# Patient Record
Sex: Female | Born: 2000 | Hispanic: Yes | Marital: Single | Smoking: Never smoker
Health system: Southern US, Community
[De-identification: ages and names within clinical notes are randomized; demographics above are authoritative.]

## PROBLEM LIST (undated history)

## (undated) HISTORY — PX: OTHER SURGICAL HISTORY: SHX169

---
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
## 2019-05-06 ENCOUNTER — Other Ambulatory Visit: Payer: Self-pay

## 2019-05-06 DIAGNOSIS — Z20822 Contact with and (suspected) exposure to covid-19: Secondary | ICD-10-CM

## 2019-05-08 LAB — NOVEL CORONAVIRUS, NAA: SARS-CoV-2, NAA: DETECTED — AB

## 2019-05-09 ENCOUNTER — Telehealth: Payer: Self-pay | Admitting: General Practice

## 2019-05-09 NOTE — Telephone Encounter (Signed)
Per pt. request, faxed COVID results to employer @ [phone], attention Jearld Lesch.

## 2019-05-09 NOTE — Telephone Encounter (Signed)
Copied from [place] [id number]. Topic: General - Other >> May 09, 2019 11:39 AM Keene Breath wrote: Reason for CRM: Patient called to request that her COVID positive test result be faxed to a secured #, [phone], Attention:  Jearld Lesch.  Patient indicated that it needs to be done as soon as possible.

## 2019-08-27 ENCOUNTER — Other Ambulatory Visit: Payer: Self-pay

## 2019-08-27 ENCOUNTER — Ambulatory Visit (LOCAL_COMMUNITY_HEALTH_CENTER): Payer: Medicaid Other

## 2019-08-27 VITALS — BP 113/71 | Ht 63.5 in | Wt 165.5 lb

## 2019-08-27 DIAGNOSIS — Z3201 Encounter for pregnancy test, result positive: Secondary | ICD-10-CM | POA: Diagnosis not present

## 2019-08-27 DIAGNOSIS — K8050 Calculus of bile duct without cholangitis or cholecystitis without obstruction: Secondary | ICD-10-CM

## 2019-08-27 HISTORY — DX: Calculus of bile duct without cholangitis or cholecystitis without obstruction: K80.50

## 2019-08-27 LAB — PREGNANCY, URINE: Preg Test, Ur: POSITIVE — AB

## 2019-08-27 MED ORDER — PRENATAL VITAMIN 27-0.8 MG PO TABS: 1.0000 | ORAL_TABLET | Freq: Every day | ORAL | 0 refills | Status: AC

## 2019-08-27 NOTE — Progress Notes (Signed)
Pt plans prenatal care at [facility]; sent to preadmit. 

## 2019-08-28 ENCOUNTER — Emergency Department
Admission: EM | Admit: 2019-08-28 | Discharge: 2019-08-28 | Disposition: A | Discharge status: Home / Self Care | Payer: Medicaid Other | Attending: Emergency Medicine | Admitting: Emergency Medicine

## 2019-08-28 ENCOUNTER — Other Ambulatory Visit: Payer: Self-pay

## 2019-08-28 ENCOUNTER — Encounter: Payer: Self-pay | Admitting: *Deleted

## 2019-08-28 ENCOUNTER — Emergency Department: Payer: Medicaid Other

## 2019-08-28 DIAGNOSIS — K8050 Calculus of bile duct without cholangitis or cholecystitis without obstruction: Secondary | ICD-10-CM

## 2019-08-28 DIAGNOSIS — Z79899 Other long term (current) drug therapy: Secondary | ICD-10-CM | POA: Insufficient documentation

## 2019-08-28 DIAGNOSIS — Z3A10 10 weeks gestation of pregnancy: Secondary | ICD-10-CM | POA: Diagnosis not present

## 2019-08-28 DIAGNOSIS — O26611 Liver and biliary tract disorders in pregnancy, first trimester: Secondary | ICD-10-CM | POA: Diagnosis present

## 2019-08-28 DIAGNOSIS — R52 Pain, unspecified: Secondary | ICD-10-CM

## 2019-08-28 LAB — URINALYSIS, COMPLETE (UACMP) WITH MICROSCOPIC
Bacteria, UA: NONE SEEN
Bilirubin Urine: NEGATIVE
Glucose, UA: NEGATIVE mg/dL
Hgb urine dipstick: NEGATIVE
Ketones, ur: 5 mg/dL — AB
Leukocytes,Ua: NEGATIVE
Nitrite: NEGATIVE
Protein, ur: 30 mg/dL — AB
Specific Gravity, Urine: 1.029 (ref 1.005–1.030)
pH: 5 (ref 5.0–8.0)

## 2019-08-28 LAB — COMPREHENSIVE METABOLIC PANEL
ALT: 24 U/L (ref 0–44)
AST: 23 U/L (ref 15–41)
Albumin: 3.7 g/dL (ref 3.5–5.0)
Alkaline Phosphatase: 56 U/L (ref 38–126)
Anion gap: 11 (ref 5–15)
BUN: 8 mg/dL (ref 6–20)
CO2: 21 mmol/L — ABNORMAL LOW (ref 22–32)
Calcium: 9.4 mg/dL (ref 8.9–10.3)
Chloride: 106 mmol/L (ref 98–111)
Creatinine, Ser: 0.51 mg/dL (ref 0.44–1.00)
GFR calc Af Amer: >60 mL/min (ref >60)
GFR calc non Af Amer: >60 mL/min (ref >60)
Glucose, Bld: 101 mg/dL — ABNORMAL HIGH (ref 70–99)
Potassium: 3.1 mmol/L — ABNORMAL LOW (ref 3.5–5.1)
Sodium: 138 mmol/L (ref 135–145)
Total Bilirubin: 0.4 mg/dL (ref 0.3–1.2)
Total Protein: 7.3 g/dL (ref 6.5–8.1)

## 2019-08-28 LAB — LIPASE, BLOOD: Lipase: 25 U/L (ref 11–51)

## 2019-08-28 LAB — CBC
HCT: 33.1 % — ABNORMAL LOW (ref 36.0–46.0)
Hemoglobin: 11.2 g/dL — ABNORMAL LOW (ref 12.0–15.0)
MCH: 24 pg — ABNORMAL LOW (ref 26.0–34.0)
MCHC: 33.8 g/dL (ref 30.0–36.0)
MCV: 71 fL — ABNORMAL LOW (ref 80.0–100.0)
Platelets: 337 10*3/uL (ref 150–400)
RBC: 4.66 MIL/uL (ref 3.87–5.11)
RDW: 15 % (ref 11.5–15.5)
WBC: 8.8 10*3/uL (ref 4.0–10.5)
nRBC: 0 % (ref 0.0–0.2)

## 2019-08-28 LAB — HCG, QUANTITATIVE, PREGNANCY: hCG, Beta Chain, Quant, S: 113463 m[IU]/mL — ABNORMAL HIGH (ref <5)

## 2019-08-28 MED ORDER — ALUM & MAG HYDROXIDE-SIMETH 200-200-20 MG/5ML PO SUSP: 30.0000 mL | Freq: Once | ORAL | Status: DC

## 2019-08-28 MED ORDER — LIDOCAINE VISCOUS HCL 2 % MT SOLN: 15.0000 mL | Freq: Once | OROMUCOSAL | Status: DC

## 2019-08-28 MED ORDER — SODIUM CHLORIDE 0.9 % IV BOLUS: 1000.0000 mL | Freq: Once | INTRAVENOUS | Status: DC

## 2019-08-28 MED ORDER — SODIUM CHLORIDE 0.9% FLUSH: 3.0000 mL | Freq: Once | INTRAVENOUS | Status: DC

## 2019-08-28 MED ORDER — ONDANSETRON HCL 4 MG/2ML IJ SOLN: 4.0000 mg | Freq: Once | INTRAMUSCULAR | Status: DC

## 2019-08-28 NOTE — ED Triage Notes (Signed)
Upper abdominal pain that started after eating cereal, had 1 episode of vomiting. Also pain radiates to the back area. Pt reports she is pregnant, LMP 06/16/19. Denies any vaginal bleeding or discharge. No fevers.

## 2019-08-28 NOTE — ED Provider Notes (Signed)
[facility] Emergency Department Provider Note  Time seen: 7:17 AM  I have reviewed the triage vital signs and the nursing notes.   HISTORY  Chief Complaint Abdominal Pain   HPI Jasmin Zimmerman is a 18 y.o. female approximately [redacted] weeks pregnant with no other past medical issues presents to the emergency department for upper abdominal pain.  Patient states she ate an approximate hour later developed upper abdominal pain.  Patient states the pain was moderate in intensity aching type pain.  She states during her stay in the emergency department the pain has completely resolved and the patient is currently pain-free.  Denies any fever cough or shortness of breath nausea vomiting or diarrhea.  No vaginal bleeding or discharge.  History reviewed. No pertinent past medical history.  There are no active problems to display for this patient.   History reviewed. No pertinent surgical history.  Prior to Admission medications   Medication Sig Start Date End Date Taking? Authorizing Provider  azithromycin (ZITHROMAX Z-PAK) 250 MG tablet Take 2 tablets (500 mg) on  Day 1,  followed by 1 tablet (250 mg) once daily on Days 2 through 5. Patient not taking: Reported on 08/27/2019 12/24/15   Arlyss Repress, PA-C  guaiFENesin-codeine 100-10 MG/5ML syrup Take 5 mLs by mouth every 4 (four) hours as needed for cough. Patient not taking: Reported on 08/27/2019 12/24/15   Beers, Pierce Crane, PA-C  ibuprofen (ADVIL,MOTRIN) 600 MG tablet Take 1 tablet (600 mg total) by mouth every 6 (six) hours as needed. Patient not taking: Reported on 08/27/2019 12/24/15   Arlyss Repress, PA-C  Prenatal Vit-Fe Fumarate-FA (PRENATAL VITAMIN) 27-0.8 MG TABS Take 1 tablet by mouth daily. 08/27/19 12/05/19  Caren Macadam, MD    No Known Allergies  No family history on file.  Social History Social History   Tobacco Use  . Smoking status: Never Smoker  . Smokeless tobacco: Never Used   Substance Use Topics  . Alcohol use: Never    Frequency: Never  . Drug use: Never    Review of Systems Constitutional: Negative for fever. Cardiovascular: Negative for chest pain. Respiratory: Negative for shortness of breath. Gastrointestinal: Upper abdominal pain, now resolved.  Negative for vomiting or diarrhea. Genitourinary: Negative for urinary compaints Musculoskeletal: Negative for musculoskeletal complaints Neurological: Negative for headache All other ROS negative  ____________________________________________   PHYSICAL EXAM:  VITAL SIGNS: ED Triage Vitals [08/28/19 0055]  Enc Vitals Group     BP 107/60     Pulse Rate 86     Resp 20     Temp 99.4 F (37.4 C)     Temp Source Oral     SpO2 100 %     Weight      Height      Head Circumference      Peak Flow      Pain Score 10     Pain Loc      Pain Edu?      Excl. in [place]?     Constitutional: Alert and oriented. Well appearing and in no distress. Eyes: Normal exam ENT      Head: Normocephalic and atraumatic.      Mouth/Throat: Mucous membranes are moist. Cardiovascular: Normal rate, regular rhythm.  Respiratory: Normal respiratory effort without tachypnea nor retractions. Breath sounds are clear Gastrointestinal: Soft and nontender. No distention.   Musculoskeletal: Nontender with normal range of motion in all extremities.  Neurologic:  Normal speech and language. No  gross focal neurologic deficits  Skin:  Skin is warm, dry and intact.  Psychiatric: Mood and affect are normal.   ____________________________________________   RADIOLOGY  Ultrasound shows gallbladder sludge with small stones but no complications seen.  ____________________________________________   INITIAL IMPRESSION / ASSESSMENT AND PLAN / ED COURSE  Pertinent labs & imaging results that were available during my care of the patient were reviewed by me and considered in my medical decision making (see chart for details).    Patient presents to the emergency department for upper abdominal pain after eating.  Differential would include gallbladder pathology or cholecystitis, gastritis or pancreatitis.  Patient's labs are largely reassuring including normal LFTs and lipase.  Right upper quadrant ultrasound shows sludge and small stones but no signs of cholecystitis.  Patient is completely pain-free at this time with a benign abdominal exam during my evaluation.  I discussed with the patient low-fat diet and continued close monitoring.  If the pain returns or she develops a fever she is to return to the emergency department for further evaluation.  Patient agreeable to plan of care.  Jasmin Zimmerman was evaluated in Emergency Department on 08/28/2019 for the symptoms described in the history of present illness. She was evaluated in the context of the global COVID-19 pandemic, which necessitated consideration that the patient might be at risk for infection with the SARS-CoV-2 virus that causes COVID-19. Institutional protocols and algorithms that pertain to the evaluation of patients at risk for COVID-19 are in a state of rapid change based on information released by regulatory bodies including the CDC and federal and state organizations. These policies and algorithms were followed during the patient's care in the ED.  ____________________________________________   FINAL CLINICAL IMPRESSION(S) / ED DIAGNOSES  Biliary colic   Minna Antis, MD 08/28/19 [phone]

## 2019-09-03 ENCOUNTER — Encounter: Payer: Self-pay | Admitting: Advanced Practice Midwife

## 2019-09-03 ENCOUNTER — Emergency Department
Admission: EM | Admit: 2019-09-03 | Discharge: 2019-09-03 | Disposition: A | Discharge status: Home / Self Care | Payer: Medicaid Other | Attending: Emergency Medicine | Admitting: Emergency Medicine

## 2019-09-03 ENCOUNTER — Other Ambulatory Visit: Payer: Self-pay

## 2019-09-03 ENCOUNTER — Ambulatory Visit: Payer: Medicaid Other | Admitting: Family Medicine

## 2019-09-03 ENCOUNTER — Encounter: Payer: Self-pay | Admitting: Family Medicine

## 2019-09-03 VITALS — BP 114/64 | Temp 98.4°F | Ht 61.5 in | Wt 160.6 lb

## 2019-09-03 DIAGNOSIS — Z3400 Encounter for supervision of normal first pregnancy, unspecified trimester: Secondary | ICD-10-CM

## 2019-09-03 DIAGNOSIS — Z8719 Personal history of other diseases of the digestive system: Secondary | ICD-10-CM | POA: Insufficient documentation

## 2019-09-03 DIAGNOSIS — E860 Dehydration: Secondary | ICD-10-CM | POA: Diagnosis not present

## 2019-09-03 DIAGNOSIS — O219 Vomiting of pregnancy, unspecified: Secondary | ICD-10-CM | POA: Insufficient documentation

## 2019-09-03 DIAGNOSIS — Z3A11 11 weeks gestation of pregnancy: Secondary | ICD-10-CM | POA: Insufficient documentation

## 2019-09-03 DIAGNOSIS — Z79899 Other long term (current) drug therapy: Secondary | ICD-10-CM | POA: Diagnosis not present

## 2019-09-03 HISTORY — DX: Personal history of other diseases of the digestive system: Z87.19

## 2019-09-03 LAB — CBC
HCT: 36.1 % (ref 36.0–46.0)
Hemoglobin: 12.2 g/dL (ref 12.0–15.0)
MCH: 24.2 pg — ABNORMAL LOW (ref 26.0–34.0)
MCHC: 33.8 g/dL (ref 30.0–36.0)
MCV: 71.5 fL — ABNORMAL LOW (ref 80.0–100.0)
Platelets: 335 10*3/uL (ref 150–400)
RBC: 5.05 MIL/uL (ref 3.87–5.11)
RDW: 14.9 % (ref 11.5–15.5)
WBC: 7.3 10*3/uL (ref 4.0–10.5)
nRBC: 0 % (ref 0.0–0.2)

## 2019-09-03 LAB — URINALYSIS
Bilirubin, UA: POSITIVE — AB
Glucose, UA: NEGATIVE
Leukocytes,UA: NEGATIVE
Nitrite, UA: NEGATIVE
Specific Gravity, UA: 1.03 (ref 1.005–1.030)
Urobilinogen, Ur: 0.2 mg/dL (ref 0.2–1.0)
pH, UA: 6 (ref 5.0–7.5)

## 2019-09-03 LAB — URINALYSIS, COMPLETE (UACMP) WITH MICROSCOPIC
Bacteria, UA: NONE SEEN
Bilirubin Urine: NEGATIVE
Glucose, UA: 50 mg/dL — AB
Hgb urine dipstick: NEGATIVE
Ketones, ur: 80 mg/dL — AB
Leukocytes,Ua: NEGATIVE
Nitrite: NEGATIVE
Protein, ur: 100 mg/dL — AB
Specific Gravity, Urine: 1.032 — ABNORMAL HIGH (ref 1.005–1.030)
pH: 6 (ref 5.0–8.0)

## 2019-09-03 LAB — COMPREHENSIVE METABOLIC PANEL
ALT: 30 U/L (ref 0–44)
AST: 30 U/L (ref 15–41)
Albumin: 4 g/dL (ref 3.5–5.0)
Alkaline Phosphatase: 66 U/L (ref 38–126)
Anion gap: 11 (ref 5–15)
BUN: 8 mg/dL (ref 6–20)
CO2: 21 mmol/L — ABNORMAL LOW (ref 22–32)
Calcium: 9.2 mg/dL (ref 8.9–10.3)
Chloride: 101 mmol/L (ref 98–111)
Creatinine, Ser: 0.49 mg/dL (ref 0.44–1.00)
GFR calc Af Amer: >60 mL/min (ref >60)
GFR calc non Af Amer: >60 mL/min (ref >60)
Glucose, Bld: 122 mg/dL — ABNORMAL HIGH (ref 70–99)
Potassium: 3.2 mmol/L — ABNORMAL LOW (ref 3.5–5.1)
Sodium: 133 mmol/L — ABNORMAL LOW (ref 135–145)
Total Bilirubin: 0.5 mg/dL (ref 0.3–1.2)
Total Protein: 7.8 g/dL (ref 6.5–8.1)

## 2019-09-03 LAB — POC URINE PREG, ED: Preg Test, Ur: POSITIVE — AB

## 2019-09-03 LAB — WET PREP FOR TRICH, YEAST, CLUE
Trichomonas Exam: NEGATIVE
Yeast Exam: NEGATIVE

## 2019-09-03 LAB — LIPASE, BLOOD: Lipase: 33 U/L (ref 11–51)

## 2019-09-03 LAB — HEMOGLOBIN, FINGERSTICK: Hemoglobin: 12.3 g/dL (ref 11.1–15.9)

## 2019-09-03 LAB — OB RESULTS CONSOLE HIV ANTIBODY (ROUTINE TESTING): HIV: NONREACTIVE

## 2019-09-03 MED ORDER — ONDANSETRON HCL 4 MG/2ML IJ SOLN
4.0000 mg | Freq: Once | INTRAMUSCULAR | Status: AC
  Administered 2019-09-03: 4 mg via INTRAVENOUS
  Filled 2019-09-03: qty 2

## 2019-09-03 MED ORDER — SODIUM CHLORIDE 0.9% FLUSH
3.0000 mL | Freq: Once | INTRAVENOUS | Status: AC
  Administered 2019-09-03: 3 mL via INTRAVENOUS

## 2019-09-03 MED ORDER — ONDANSETRON HCL 4 MG PO TABS: 4.0000 mg | ORAL_TABLET | Freq: Three times a day (TID) | ORAL | 0 refills | Status: DC | PRN

## 2019-09-03 MED ORDER — LACTATED RINGERS IV BOLUS
2000.0000 mL | Freq: Once | INTRAVENOUS | Status: AC
  Administered 2019-09-03: 2000 mL via INTRAVENOUS

## 2019-09-03 NOTE — Progress Notes (Signed)
Record abstracted per 09/01/19 phone interview with Tawanna Solo, RN; Debera Lat, RN

## 2019-09-03 NOTE — ED Notes (Signed)
Pt given crackers and gingerale.

## 2019-09-03 NOTE — ED Provider Notes (Signed)
[facility] Emergency Department Provider Note  ____________________________________________   First MD Initiated Contact with Patient 09/03/19 1954     (approximate)  I have reviewed the triage vital signs and the nursing notes.   HISTORY  Chief Complaint Emesis    HPI Jasmin Zimmerman Jasmin Zimmerman is a 18 y.o. female  Here with n/v. Pt is currently [redacted] weeks pregnant, was sent here from OB 2/2 dehydration. She has been having persistently worsening nausea, vomiting over the past several weeks. She states that over the past week, she's had persistent n/v with decreased food intake. She went to her OB today and was noted to have ketonuria, so was sent here for fluids. She also was seen recently for abd pain w/ GB sludge, but has had no ongoing abd pain or epigastric or RUQ pain. NO fevers. NO urinary sx. No vaginal bleeding or discharge.        Past Medical History:  Diagnosis Date  . Biliary colic 08/27/2019   @ ED-small gallstones seem    Patient Active Problem List   Diagnosis Date Noted  . Supervision of normal first pregnancy, antepartum 09/03/2019  . Nausea/vomiting in pregnancy 09/03/2019  . History of biliary colic 09/03/2019    Past Surgical History:  Procedure Laterality Date  . left shoulder surgery Left    Left shoulder surgery in middle school, unsure date and reason    Prior to Admission medications   Medication Sig Start Date End Date Taking? Authorizing Provider  ondansetron (ZOFRAN) 4 MG tablet Take 1 tablet (4 mg total) by mouth every 8 (eight) hours as needed for nausea or vomiting. 09/03/19   Staples, Hulda Humphrey, PA-C  Prenatal Vit-Fe Fumarate-FA (PRENATAL VITAMIN) 27-0.8 MG TABS Take 1 tablet by mouth daily. 08/27/19 12/05/19  Federico Flake, MD    Allergies Patient has no known allergies.  Family History  Problem Relation Age of Onset  . Diabetes Maternal Uncle   . Diabetes Maternal Grandmother   . Vision loss Maternal  Grandmother   . Diabetes Maternal Grandfather   . Kidney disease Maternal Grandfather     Social History Social History   Tobacco Use  . Smoking status: Never Smoker  . Smokeless tobacco: Never Used  Substance Use Topics  . Alcohol use: Never    Frequency: Never  . Drug use: Never    Review of Systems  Review of Systems  Constitutional: Positive for fatigue. Negative for fever.  HENT: Negative for congestion and sore throat.   Eyes: Negative for visual disturbance.  Respiratory: Negative for cough and shortness of breath.   Cardiovascular: Negative for chest pain.  Gastrointestinal: Positive for nausea and vomiting. Negative for abdominal pain and diarrhea.  Genitourinary: Negative for flank pain.  Musculoskeletal: Negative for back pain and neck pain.  Skin: Negative for rash and wound.  Neurological: Negative for weakness.  All other systems reviewed and are negative.    ____________________________________________  PHYSICAL EXAM:      VITAL SIGNS: ED Triage Vitals  Enc Vitals Group     BP 09/03/19 1825 131/81     Pulse Rate 09/03/19 1825 88     Resp 09/03/19 1825 20     Temp 09/03/19 1825 99.5 F (37.5 C)     Temp Source 09/03/19 1825 Oral     SpO2 09/03/19 1825 100 %     Weight 09/03/19 1825 160 lb (72.6 kg)     Height 09/03/19 1825 5' 1.5" (1.562 m)  Head Circumference --      Peak Flow --      Pain Score 09/03/19 1836 0     Pain Loc --      Pain Edu? --      Excl. in [place]? --      Physical Exam Vitals signs and nursing note reviewed.  Constitutional:      General: She is not in acute distress.    Appearance: She is well-developed.  HENT:     Head: Normocephalic and atraumatic.     Mouth/Throat:     Mouth: Mucous membranes are dry.  Eyes:     Conjunctiva/sclera: Conjunctivae normal.  Neck:     Musculoskeletal: Neck supple.  Cardiovascular:     Rate and Rhythm: Normal rate and regular rhythm.     Heart sounds: Normal heart sounds. No  murmur. No friction rub.  Pulmonary:     Effort: Pulmonary effort is normal. No respiratory distress.     Breath sounds: Normal breath sounds. No wheezing or rales.  Abdominal:     General: There is no distension.     Palpations: Abdomen is soft.     Tenderness: There is no abdominal tenderness.  Skin:    General: Skin is warm.     Capillary Refill: Capillary refill takes less than 2 seconds.  Neurological:     Mental Status: She is alert and oriented to person, place, and time.     Motor: No abnormal muscle tone.       ____________________________________________   LABS (all labs ordered are listed, but only abnormal results are displayed)  Labs Reviewed  COMPREHENSIVE METABOLIC PANEL - Abnormal; Notable for the following components:      Result Value   Sodium 133 (*)    Potassium 3.2 (*)    CO2 21 (*)    Glucose, Bld 122 (*)    All other components within normal limits  CBC - Abnormal; Notable for the following components:   MCV 71.5 (*)    MCH 24.2 (*)    All other components within normal limits  URINALYSIS, COMPLETE (UACMP) WITH MICROSCOPIC - Abnormal; Notable for the following components:   Color, Urine YELLOW (*)    APPearance HAZY (*)    Specific Gravity, Urine 1.032 (*)    Glucose, UA 50 (*)    Ketones, ur 80 (*)    Protein, ur 100 (*)    All other components within normal limits  POC URINE PREG, ED - Abnormal; Notable for the following components:   Preg Test, Ur Positive (*)    All other components within normal limits  LIPASE, BLOOD    ____________________________________________  EKG: None ________________________________________  RADIOLOGY All imaging, including plain films, CT scans, and ultrasounds, independently reviewed by me, and interpretations confirmed via formal radiology reads.  ED MD interpretation:   None  Official radiology report(s): No results found.  ____________________________________________  PROCEDURES    Procedure(s) performed (including Critical Care):  Procedures  ____________________________________________  INITIAL IMPRESSION / MDM / ASSESSMENT AND PLAN / ED COURSE  As part of my medical decision making, I reviewed the following data within the electronic MEDICAL RECORD NUMBER Nursing notes reviewed and incorporated, Old chart reviewed, Notes from prior ED visits, and [place] Controlled Substance Database       *Emberly M Jasmin PotterSosa Jimenez was evaluated in Emergency Department on 09/03/2019 for the symptoms described in the history of present illness. She was evaluated in the context of the global COVID-19 pandemic,  which necessitated consideration that the patient might be at risk for infection with the SARS-CoV-2 virus that causes COVID-19. Institutional protocols and algorithms that pertain to the evaluation of patients at risk for COVID-19 are in a state of rapid change based on information released by regulatory bodies including the CDC and federal and state organizations. These policies and algorithms were followed during the patient's care in the ED.  Some ED evaluations and interventions may be delayed as a result of limited staffing during the pandemic.*     Medical Decision Making:  18 yo G1P0 here with dehydration 2/2 hyperemesis gravidarum. She has a h/o GB sludge but has no abd pain, no RUQ TTP, normal bili and LFTs - doubt cholecystitis. Abd is otherwise benign. UA is without bacteriuria or pyuria. She has ketonuria but no significant hyperglycemia, normal AG - doubt DKA. Labs are c/w dehydration.  Pt given fluids, Zofran. Tolerated well and is now eating/drinking. Meds were called in her by her OB.  ____________________________________________  FINAL CLINICAL IMPRESSION(S) / ED DIAGNOSES  Final diagnoses:  Dehydration  Nausea/vomiting in pregnancy     MEDICATIONS GIVEN DURING THIS VISIT:  Medications  sodium chloride flush (NS) 0.9 % injection 3 mL (3 mLs Intravenous Given 09/03/19  2014)  lactated ringers bolus 2,000 mL (2,000 mLs Intravenous New Bag/Given 09/03/19 2013)  ondansetron (ZOFRAN) injection 4 mg (4 mg Intravenous Given 09/03/19 2014)     ED Discharge Orders    None       Note:  This document was prepared using Dragon voice recognition software and may include unintentional dictation errors.   Duffy Bruce, MD 09/03/19 2137

## 2019-09-03 NOTE — ED Triage Notes (Signed)
Pt comes via POV from home with c/o vomiting. Pt states she is about [redacted] weeks pregnant.  Pt states she has had this vomiting going on since November when she found out she was pregnant.  Pt states she went to [facility] appt today and had blood drawn. Pt states she was then informed that she was dehydrated and needed to come here.

## 2019-09-03 NOTE — Progress Notes (Addendum)
[place] DEPT [facility] [place] [id number] [phone]  INITIAL PRENATAL VISIT NOTE  Subjective:  Jasmin Zimmerman is a 18 y.o. G1P0000 at [redacted]w[redacted]d being seen today to start prenatal care at the [facility].  She is currently monitored for the following issues for this low-risk pregnancy and has Supervision of normal first pregnancy, antepartum on their problem list.  Patient reports nausea.  Contractions: Not present. Vag. Bleeding: None.  Movement: Absent. Denies leaking of fluid.   Here for initial ob visit. Pt states she is feeling happy about this pregnancy. She lives with her mom in [place], not currently working. Her boyfriend works at [employer] Denies any substance use, tobacco use or alcohol. Unsure of exact date of LMP, believes Sept 14.   States she has had frequent nausea and vomiting, 3x/day. Currently taking no medication. Yesterday ate chicken soup, tortillas and 1 bottle of water. Today has eaten crackers only. Believes she has gained approximately 5 lbs with this pregnancy.   Seen in ER at [facility] 11/26 w/pain/nausea. U/s showed gallbladder sludge w/small stones, no complications. Diagnosed w/biliary colic, advised low fat diet. She hasn't had pain since this visit, has changed diet.    The following portions of the patient's history were reviewed and updated as appropriate: allergies, current medications, past family history, past medical history, past social history, past surgical history and problem list. Problem list updated.  Objective:   Vitals:   09/03/19 1420 09/03/19 1422  BP: 114/64   Temp: 98.4 F (36.9 C)   Weight: 160 lb 9.6 oz (72.8 kg)   Height:  5' 1.5" (1.562 m)    Fetal Status: Fetal Heart Rate (bpm): 160 Fundal Height: 11 cm Movement: Absent      Physical Exam Vitals signs and nursing note reviewed.  Constitutional:      General: She is not in acute  distress.    Appearance: Normal appearance. She is well-developed.  HENT:     Head: Normocephalic and atraumatic.     Right Ear: External ear normal.     Left Ear: External ear normal.     Nose: Nose normal. No congestion or rhinorrhea.     Mouth/Throat:     Lips: Pink.     Mouth: Mucous membranes are moist.     Dentition: Normal dentition. No dental caries.     Pharynx: Oropharynx is clear. Uvula midline.  Eyes:     General: No scleral icterus.    Conjunctiva/sclera: Conjunctivae normal.  Neck:     Thyroid: No thyroid mass or thyromegaly.  Cardiovascular:     Rate and Rhythm: Normal rate.     Pulses: Normal pulses.     Comments: Extremities are warm and well perfused Pulmonary:     Effort: Pulmonary effort is normal.     Breath sounds: Normal breath sounds.  Chest:     Breasts: Breasts are symmetrical.        Right: No mass, nipple discharge or skin change.        Left: No mass, nipple discharge or skin change.  Abdominal:     General: Abdomen is flat.     Palpations: Abdomen is soft.     Tenderness: There is no abdominal tenderness.     Comments: Gravid   Genitourinary:    General: Normal vulva.     Exam position: Lithotomy position.     Pubic Area: No rash.  Labia:        Right: No rash.        Left: No rash.      Vagina: Normal. No vaginal discharge.     Cervix: No cervical motion tenderness or friability.     Uterus: Normal. Enlarged (Gravid 11 wks size). Not tender.      Adnexa: Right adnexa normal and left adnexa normal.     Rectum: Normal. No external hemorrhoid.  Musculoskeletal:     Right lower leg: No edema.     Left lower leg: No edema.  Lymphadenopathy:     Upper Body:     Right upper body: No axillary adenopathy.     Left upper body: No axillary adenopathy.  Skin:    General: Skin is warm.     Capillary Refill: Capillary refill takes less than 2 seconds.  Neurological:     Mental Status: She is alert.    Results for orders placed or  performed in visit on 09/03/19  WET PREP FOR TRICH, YEAST, CLUE  Result Value Ref Range   Trichomonas Exam Negative Negative   Yeast Exam Negative Negative   Clue Cell Exam Comment: Negative  Hemoglobin, venipuncture  Result Value Ref Range   Hemoglobin 12.3 11.1 - 15.9 g/dL  Urinalysis (Urine Dip)  Result Value Ref Range   Specific Gravity, UA 1.030 1.005 - 1.030   pH, UA 6.0 5.0 - 7.5   Color, UA Yellow Yellow   Appearance Ur Clear Clear   Leukocytes,UA Negative Negative   Protein,UA 1+ (A) Negative/Trace   Glucose, UA Negative Negative   Ketones, UA 4+ (A) Negative   RBC, UA Trace (A) Negative   Bilirubin, UA Positive (A) Negative   Urobilinogen, Ur 0.2 0.2 - 1.0 mg/dL   Nitrite, UA Negative Negative     Assessment and Plan:  Pregnancy: G1P0000 at [redacted]w[redacted]d  1. Supervision of normal first pregnancy, antepartum -New pt, labs as below.  -Pt desires 1st trimester screen. Paperwork completed, faxed to [facility] today: - Prenatal profile without Varicella/Rubella ([id number]) - HGB FRAC. W/SOLUBILITY - HIV [place] [facility] - Lead, blood (adult age 27 yrs or greater) - Chlamydia/GC NAA, Confirmation - Urine Culture - Hemoglobin, venipuncture - Urinalysis (Urine Dip) - WET PREP FOR TRICH, YEAST, CLUE  2. Nausea/vomiting in pregnancy -Urine dip with 4+ ketones today. Will send to ER for IV antiemetics, hydration. -Rx zofran to be filled after ER visit.  -Advised small, frequent meals containing protein. RTC Monday for close f/u.  - ondansetron (ZOFRAN) 4 MG tablet; Take 1 tablet (4 mg total) by mouth every 8 (eight) hours as needed for nausea or vomiting.  Dispense: 20 tablet; Refill: 0   Discussed overview of care and coordination with inpatient delivery practices including [facility], [facility], [facility] and [facility].   Reviewed Centering pregnancy as standard of care at [facility]: she declines.  Preterm labor symptoms and general obstetric precautions including but  not limited to vaginal bleeding, contractions, leaking of fluid and fetal movement were reviewed in detail with the patient.  Please refer to After Visit Summary for other counseling recommendations.   No follow-ups on file.  No future appointments.  Ann Held, PA-C

## 2019-09-03 NOTE — Progress Notes (Signed)
Patient here for new OB visit. 1st pregnancy, [place] visit 08/27/2019 with N/V. Having daily N/V. Lives with mother. Patient not working outside the home currently. FOB involved and working.Jenetta Downer, RN

## 2019-09-03 NOTE — Progress Notes (Signed)
Patient referral to ED copied for scanning. Patient refused flu vaccine today, and wants to think about it. [facility] 1st trimester screen referral faxed with confirmation, appointment pending.Jenetta Downer, RN

## 2019-09-04 ENCOUNTER — Other Ambulatory Visit: Payer: Self-pay | Admitting: Family Medicine

## 2019-09-04 DIAGNOSIS — Z369 Encounter for antenatal screening, unspecified: Secondary | ICD-10-CM

## 2019-09-04 LAB — CBC/D/PLT+RPR+RH+ABO+AB SCR
Antibody Screen: NEGATIVE
Basophils Absolute: 0 10*3/uL (ref 0.0–0.2)
Basos: 0 %
EOS (ABSOLUTE): 0 10*3/uL (ref 0.0–0.4)
Eos: 0 %
Hematocrit: 36.8 % (ref 34.0–46.6)
Hemoglobin: 12.3 g/dL (ref 11.1–15.9)
Hepatitis B Surface Ag: NEGATIVE
Immature Grans (Abs): 0 10*3/uL (ref 0.0–0.1)
Immature Granulocytes: 0 %
Lymphocytes Absolute: 1.3 10*3/uL (ref 0.7–3.1)
Lymphs: 18 %
MCH: 24.5 pg — ABNORMAL LOW (ref 26.6–33.0)
MCHC: 33.4 g/dL (ref 31.5–35.7)
MCV: 73 fL — ABNORMAL LOW (ref 79–97)
Monocytes Absolute: 0.4 10*3/uL (ref 0.1–0.9)
Monocytes: 6 %
Neutrophils Absolute: 5.5 10*3/uL (ref 1.4–7.0)
Neutrophils: 76 %
Platelets: 346 10*3/uL (ref 150–450)
RBC: 5.03 x10E6/uL (ref 3.77–5.28)
RDW: 15.9 % — ABNORMAL HIGH (ref 11.7–15.4)
RPR Ser Ql: NONREACTIVE
Rh Factor: POSITIVE
WBC: 7.2 10*3/uL (ref 3.4–10.8)

## 2019-09-05 ENCOUNTER — Telehealth: Payer: Self-pay

## 2019-09-05 LAB — CHLAMYDIA/GC NAA, CONFIRMATION
Chlamydia trachomatis, NAA: NEGATIVE
Neisseria gonorrhoeae, NAA: NEGATIVE

## 2019-09-05 LAB — URINE CULTURE: Organism ID, Bacteria: NO GROWTH

## 2019-09-05 NOTE — Telephone Encounter (Signed)
Phone call to patient to inform of scheduled DP appt. Informed of 09/11/2019 12:00 and 1:00 [place] and [place] appt. Instructed to arrive at [facility] by 11:45 for registration. Hal Morales, RN

## 2019-09-08 ENCOUNTER — Ambulatory Visit: Payer: Medicaid Other

## 2019-09-08 LAB — HGB FRAC. W/SOLUBILITY
Hgb A2 Quant: 2.1 % (ref 1.8–3.2)
Hgb A: 97.9 % (ref 96.4–98.8)
Hgb C: 0 %
Hgb F Quant: 0 % (ref 0.0–2.0)
Hgb S: 0 %
Hgb Solubility: NEGATIVE
Hgb Variant: 0 %

## 2019-09-08 LAB — LEAD, BLOOD (ADULT >= 16 YRS): Lead-Whole Blood: <1 ug/dL (ref 0–4)

## 2019-09-09 ENCOUNTER — Ambulatory Visit: Payer: Medicaid Other | Admitting: Advanced Practice Midwife

## 2019-09-09 ENCOUNTER — Other Ambulatory Visit: Payer: Self-pay

## 2019-09-09 VITALS — BP 110/60 | Temp 97.6°F | Wt 164.6 lb

## 2019-09-09 DIAGNOSIS — O219 Vomiting of pregnancy, unspecified: Secondary | ICD-10-CM

## 2019-09-09 DIAGNOSIS — Z3400 Encounter for supervision of normal first pregnancy, unspecified trimester: Secondary | ICD-10-CM

## 2019-09-09 LAB — URINALYSIS
Bilirubin, UA: POSITIVE — AB
Glucose, UA: NEGATIVE
Leukocytes,UA: NEGATIVE
Nitrite, UA: NEGATIVE
RBC, UA: NEGATIVE
Specific Gravity, UA: 1.03 (ref 1.005–1.030)
Urobilinogen, Ur: 2 mg/dL — ABNORMAL HIGH (ref 0.2–1.0)
pH, UA: 6 (ref 5.0–7.5)

## 2019-09-09 NOTE — Progress Notes (Signed)
   PRENATAL VISIT NOTE  Subjective:  Jasmin Zimmerman is a 18 y.o. G1P0000 at [redacted]w[redacted]d being seen today for ongoing prenatal care.  She is currently monitored for the following issues for this low-risk pregnancy and has Supervision of normal first pregnancy, antepartum; Nausea/vomiting in pregnancy; and History of biliary colic on their problem list.  Patient reports vomiting.  Contractions: Not present. Vag. Bleeding: None.  Movement: Absent. Denies leaking of fluid/ROM.   The following portions of the patient's history were reviewed and updated as appropriate: allergies, current medications, past family history, past medical history, past social history, past surgical history and problem list. Problem list updated.  Objective:   Vitals:   09/09/19 1614  BP: 110/60  Temp: 97.6 F (36.4 C)  Weight: 164 lb 9.6 oz (74.7 kg)    Fetal Status: Fetal Heart Rate (bpm): 150   Movement: Absent     General:  Alert, oriented and cooperative. Patient is in no acute distress.  Skin: Skin is warm and dry. No rash noted.   Cardiovascular: Normal heart rate noted  Respiratory: Normal respiratory effort, no problems with respiration noted  Abdomen: Soft, gravid, appropriate for gestational age.  Pain/Pressure: Absent     Pelvic: Cervical exam deferred        Extremities: Normal range of motion.  Edema: None  Mental Status: Normal mood and affect. Normal behavior. Normal judgment and thought content.   Assessment and Plan:  Pregnancy: G1P0000 at [redacted]w[redacted]d  1. Supervision of normal first pregnancy, antepartum N&V improving Pt reminded of [facility] u/s 09/11/19  2. Nausea/vomiting in pregnancy Sent to ER 09/03/19 with 4+ ketones and N&V; received IVF and IV Zofran.  Last vomited yesterday x1; time before was 09/07/19 x1.  No breakfast today, lunch: 1 large piece of pizza, water, apple (1645 now).  Pt has not needed to pick up rx in pharmacy for N&V.  U/a 1+ ketones, 1+ bilirubin, tr protein, sp grav  >1.030 - Urinalysis (Urine Dip)   Preterm labor symptoms and general obstetric precautions including but not limited to vaginal bleeding, contractions, leaking of fluid and fetal movement were reviewed in detail with the patient. Please refer to After Visit Summary for other counseling recommendations.  No follow-ups on file.  Future Appointments  Date Time Provider [place]  09/11/2019 12:00 PM [facility] None  09/11/2019  1:00 PM [facility] [place] 1 [facility]    Herbie Saxon, CNM

## 2019-09-09 NOTE — Progress Notes (Addendum)
Reports improvement in nausea and vomiting since had IV fluids and Zofran in ED 09/03/2019 although still vomiting at times. Did not retrieve Ondansetron prescription from pharmacy. Rich Number, RN  Aware of [facility] appt 09/11/2019. Rich Number, RN  Urine dip results reviewed by E. Sciora CNM and no new orders given. Rich Number, RN

## 2019-09-11 ENCOUNTER — Ambulatory Visit
Admission: RE | Admit: 2019-09-11 | Discharge: 2019-09-11 | Disposition: A | Discharge status: Home / Self Care | Payer: Medicaid Other | Source: Ambulatory Visit | Attending: Obstetrics and Gynecology | Admitting: Obstetrics and Gynecology

## 2019-09-11 ENCOUNTER — Other Ambulatory Visit: Payer: Self-pay

## 2019-09-11 ENCOUNTER — Ambulatory Visit: Payer: Medicaid Other

## 2019-09-11 ENCOUNTER — Ambulatory Visit (HOSPITAL_BASED_OUTPATIENT_CLINIC_OR_DEPARTMENT_OTHER)
Admission: RE | Admit: 2019-09-11 | Discharge: 2019-09-11 | Disposition: A | Discharge status: Home / Self Care | Payer: Medicaid Other | Source: Ambulatory Visit | Attending: Obstetrics and Gynecology | Admitting: Obstetrics and Gynecology

## 2019-09-11 VITALS — BP 133/69 | HR 101 | Temp 98.2°F | Wt 164.0 lb

## 2019-09-11 DIAGNOSIS — Z3400 Encounter for supervision of normal first pregnancy, unspecified trimester: Secondary | ICD-10-CM

## 2019-09-11 DIAGNOSIS — Z3A13 13 weeks gestation of pregnancy: Secondary | ICD-10-CM | POA: Insufficient documentation

## 2019-09-11 DIAGNOSIS — Z369 Encounter for antenatal screening, unspecified: Secondary | ICD-10-CM

## 2019-09-11 DIAGNOSIS — Z36 Encounter for antenatal screening for chromosomal anomalies: Secondary | ICD-10-CM | POA: Insufficient documentation

## 2019-09-11 NOTE — Progress Notes (Signed)
See u/s report 13w 6d - dates changed to [facility] 03/13/2019   Jasmin Zimmerman

## 2019-09-11 NOTE — Progress Notes (Signed)
Virtual Visit via Telephone Note  I connected with Jasmin Zimmerman on 09/11/2019 at 12:00 PM EST by telephone and verified that I am speaking with the correct person using two identifiers.  Referring provider:  [facility] Department Length of consultation:  25 minutes  Jasmin Zimmerman was referred to [facility] for genetic counseling to review prenatal screening and testing options.  This note summarizes the information we discussed.    We offered the following routine screening tests for this pregnancy:  The most accurate screening option for chromosome conditions is cell free fetal DNA testing.  Though this is typically reserved for pregnancies at increased risk for aneuploidy, it is currently being made available and many insurance companies are adding coverage for this testing in low risk patients during Jasmin Zimmerman.  This test utilizes a maternal blood sample and DNA sequencing technology to isolate circulating cell free fetal DNA from maternal plasma.  The fetal DNA can then be analyzed for DNA sequences that are derived from the three most common chromosomes involved in aneuploidy, chromosomes 13, 18, and 21.  If the overall amount of DNA is greater than the expected level for any of these chromosomes, aneuploidy is suspected.  The detection rates are >99% for Down syndrome, >98% for trisomy 18 and >91% for Trisomy 13.  While we do not consider it a replacement for invasive testing and karyotype analysis, a negative result from this testing would be reassuring, though not a guarantee of a normal chromosome complement for the baby.  An abnormal result may be suggestive of an abnormal chromosome complement, though we would still recommend CVS or amniocentesis to confirm any findings from this testing.  First trimester screening, which includes nuchal translucency ultrasound screen and first trimester maternal serum marker screening, is the test that has most  recently been available for low risk patients.  The nuchal translucency has approximately an 80% detection rate for Down syndrome and can be positive for other chromosome abnormalities as well as congenital heart defects.  When combined with a maternal serum marker screening, the detection rate is up to 90% for Down syndrome and up to 97% for trisomy 18.   Given current recommendations during COVID, we are offering only the biochemical testing portion of this testing (without the ultrasound and NT portion), which has a much lower detection rate.  Maternal serum marker screening, or "quad" screen, is a blood test that measures pregnancy proteins, can provide risk assessments for Down syndrome, trisomy 18, and open neural tube defects (spina bifida, anencephaly). Because it does not directly examine the fetus, it cannot positively diagnose or rule out these problems. This is a second trimester option which could be offered along with the anatomy ultrasound. It can detect approximately 75% of babies with Down syndrome, 80% of babies with open spina bifida and 70% of babies with trisomy 40.  Targeted ultrasound uses high frequency sound waves to create an image of the developing fetus.  An ultrasound is often recommended as a routine means of evaluating the pregnancy.  It is also used to screen for fetal anatomy problems (for example, a heart defect) that might be suggestive of a chromosomal or other abnormality. We are currently not recommending a first trimester ultrasound other than that which would be ordered for dating and viability.  Should these screening tests indicate an increased concern, then the following additional testing options would be offered:  The chorionic villus sampling procedure is available for  first trimester chromosome analysis.  This involves the withdrawal of a small amount of chorionic villi (tissue from the developing placenta).  Risk of pregnancy loss is estimated to be  approximately 1 in 200 to 1 in 100 (0.5 to 1%).  There is approximately a 1% (1 in 100) chance that the CVS chromosome results will be unclear.  Chorionic villi cannot be tested for neural tube defects.     Amniocentesis involves the removal of a small amount of amniotic fluid from the sac surrounding the fetus with the use of a thin needle inserted through the maternal abdomen and uterus.  Ultrasound guidance is used throughout the procedure.  Fetal cells from amniotic fluid are directly evaluated and > 99.5% of chromosome problems and > 98% of open neural tube defects can be detected. This procedure is generally performed after the 15th week of pregnancy.  The main risks to this procedure include complications leading to miscarriage in less than 1 in 200 cases (0.5%).  Cystic Fibrosis and Spinal Muscular Atrophy (SMA) screening were also discussed with the patient. Both conditions are recessive, which means that both parents must be carriers in order to have a child with the disease.  Cystic fibrosis (CF) is one of the most common genetic conditions in persons of Caucasian ancestry.  This condition occurs in approximately 1 in 2,500 Caucasian persons and results in thickened secretions in the lungs, digestive, and reproductive systems.  For a baby to be at risk for having CF, both of the parents must be carriers for this condition.  Approximately 1 in 5325 Caucasian persons is a carrier for CF.  Current carrier testing looks for the most common mutations in the gene for CF and can detect approximately 90% of carriers in the Caucasian population.  This means that the carrier screening can greatly reduce, but cannot eliminate, the chance for an individual to have a child with CF.  If an individual is found to be a carrier for CF, then carrier testing would be available for the partner. As part of [place]'s newborn screening profile, all babies born in the state of [place] will have a two-tier  screening process.  Specimens are first tested to determine the concentration of immunoreactive trypsinogen (IRT).  The top 5% of specimens with the highest IRT values then undergo DNA testing using a panel of over 40 common CF mutations. SMA is a neurodegenerative disorder that leads to atrophy of skeletal muscle and overall weakness.  This condition is also more prevalent in the Caucasian population, with 1 in 40-1 in 60 persons being a carrier and 1 in 6,000-1 in 10,000 children being affected.  There are multiple forms of the disease, with some causing death in infancy to other forms with survival into adulthood.  The genetics of SMA is complex, but carrier screening can detect up to 95% of carriers in the Caucasian population.  Similar to CF, a negative result can greatly reduce, but cannot eliminate, the chance to have a child with SMA. The patient declined carrier screening for CF and SMA.  We talked about the option of signing up for Early Check to have the baby tested for SMA after delivery as part of a new study in [place].  This registration can be done online prior to delivery if desired.  We obtained a detailed family history and pregnancy history.  The family history was reported to be unremarkable for birth defects, intellectual delays, recurrent pregnancy loss or known chromosome abnormalities.  This is the first pregnancy for Jasmin Zimmerman and her partner. She reported no complications or exposures in this pregnancy that would be expected to increase the risk for birth defects.  After consideration of the options, Jasmin Zimmerman elected to have MaterniT21 PLUS with SCA drawn at [facility] on 09/11/2019 following her dating ultrasound.  The patient declined carrier testing for CF and SMA.  The patient was encouraged to call with questions or concerns.  We can be contacted at [phone].  Labs ordered: Mat21 PLUS with SCA at [facility]  Cherly Anderson, MS, CGC  I provided  25 minutes of non-face-to-face time during this encounter.   Katrina Stack

## 2019-09-12 ENCOUNTER — Encounter: Payer: Self-pay | Admitting: Family Medicine

## 2019-09-12 DIAGNOSIS — Z3400 Encounter for supervision of normal first pregnancy, unspecified trimester: Secondary | ICD-10-CM

## 2019-09-17 LAB — MATERNIT21 PLUS CORE+SCA
Fetal Fraction: 12
Monosomy X (Turner Syndrome): NOT DETECTED
Result (T21): NEGATIVE
Trisomy 13 (Patau syndrome): NEGATIVE
Trisomy 18 (Edwards syndrome): NEGATIVE
Trisomy 21 (Down syndrome): NEGATIVE
XXX (Triple X Syndrome): NOT DETECTED
XXY (Klinefelter Syndrome): NOT DETECTED
XYY (Jacobs Syndrome): NOT DETECTED

## 2019-09-18 ENCOUNTER — Telehealth: Payer: Self-pay | Admitting: Obstetrics and Gynecology

## 2019-09-18 NOTE — Telephone Encounter (Signed)
The patient was informed of the results of her recent MaterniT21 testing which yielded NEGATIVE results.  The patient's specimen showed DNA consistent with two copies of chromosomes 21, 18 and 13.  The sensitivity for trisomy 21, trisomy 18 and trisomy 13 using this testing are reported as 99.1%, 99.9% and 91.7% respectively.  Thus, while the results of this testing are highly accurate, they are not considered diagnostic at this time.  Should more definitive information be desired, the patient may still consider amniocentesis.   As requested to know by the patient, sex chromosome analysis was included for this sample.  Results are consistent with a female fetus. This is predicted with >99% accuracy.  A maternal serum AFP only should be considered if screening for neural tube defects is desired.  We may be reached at [phone] with any questions or concerns.   Denee Boeder F. Chadley Dziedzic, MS, CGC   

## 2019-09-30 ENCOUNTER — Ambulatory Visit: Payer: Medicaid Other | Admitting: Family Medicine

## 2019-09-30 ENCOUNTER — Other Ambulatory Visit: Payer: Self-pay

## 2019-09-30 ENCOUNTER — Encounter: Payer: Self-pay | Admitting: Family Medicine

## 2019-09-30 VITALS — BP 103/64 | HR 82 | Temp 98.8°F | Wt 164.8 lb

## 2019-09-30 DIAGNOSIS — Z1379 Encounter for other screening for genetic and chromosomal anomalies: Secondary | ICD-10-CM

## 2019-09-30 DIAGNOSIS — Z3400 Encounter for supervision of normal first pregnancy, unspecified trimester: Secondary | ICD-10-CM

## 2019-09-30 NOTE — Progress Notes (Signed)
  PRENATAL VISIT NOTE  Subjective:  Jasmin Zimmerman is a 18 y.o. G1P0000 at [redacted]w[redacted]d being seen today for ongoing prenatal care.  She is currently monitored for the following issues for this low-risk pregnancy and has Supervision of normal first pregnancy, antepartum; Nausea/vomiting in pregnancy - 1st trimester, now resolved; History of biliary colic; and Encounter for antenatal screening for chromosomal anomalies on their problem list.  Patient reports no complaints.  Contractions: Not present. Vag. Bleeding: None.  Movement: Absent. Denies leaking of fluid/ROM.   The following portions of the patient's history were reviewed and updated as appropriate: allergies, current medications, past family history, past medical history, past social history, past surgical history and problem list. Problem list updated.  Objective:   Vitals:   09/30/19 1623  BP: 103/64  Pulse: 82  Temp: 98.8 F (37.1 C)  Weight: 164 lb 12.8 oz (74.8 kg)    Fetal Status: Fetal Heart Rate (bpm): 150 Fundal Height: 16 cm Movement: Absent     General:  Alert, oriented and cooperative. Patient is in no acute distress.  Skin: Skin is warm and dry. No rash noted.   Cardiovascular: Normal heart rate noted  Respiratory: Normal respiratory effort, no problems with respiration noted  Abdomen: Soft, gravid, appropriate for gestational age.  Pain/Pressure: Absent     Pelvic: Cervical exam deferred        Extremities: Normal range of motion.  Edema: None  Mental Status: Normal mood and affect. Normal behavior. Normal judgment and thought content.   Assessment and Plan:  Pregnancy: G1P0000 at [redacted]w[redacted]d   1. Supervision of normal first pregnancy, antepartum -Up to date. Has anatomy u/s scheduled for 1/14.  -States she will restart a new PNV today. -N/v from prior visit now resolved.   2. Encounter for genetic screening for birth defect - AFP Only [facility]- Scanned result   Preterm labor symptoms and general obstetric  precautions including but not limited to vaginal bleeding, contractions, leaking of fluid and fetal movement were reviewed in detail with the patient. Please refer to After Visit Summary for other counseling recommendations.  Return in about 4 weeks (around 10/28/2019) for routine prenatal care.  Future Appointments  Date Time Provider [place]  10/16/2019  9:00 AM [facility] [place] [facility]  10/28/2019  3:20 PM [facility] PROVIDER [facility] None    Kandee Keen, PA-C

## 2019-09-30 NOTE — Progress Notes (Signed)
Client aware of anatomy US on 10/16/19 at 0900 with arrival time of 0845. Denies international travel for self or FOB since pregnant.  PNVt only taken 2 - 3 days in past 7 due to doesn't like flavors of gummy PNV. Encouraged to purchase different PNV. AFP only today. Per client, sex of baby is female. Rich Number, RN

## 2019-10-03 NOTE — L&D Delivery Note (Signed)
Delivery Note  First Stage: Labor onset: 03/08/20 prodromal labor Augmentation: AROM, Pitocin Analgesia /Anesthesia intrapartum: epidural AROM at 0438, meconium  Second Stage: Complete dilation at 1142 Onset of pushing at 1145 FHR second stage Cat II, variable decels, mod variability throughout.   Delivery of a viable female infant on 03/09/20 at 1258 by Heloise Ochoa CNM delivery of fetal head in ROA position with restitution to ROP, No nuchal cord;  Anterior then posterior shoulders delivered easily with gentle downward traction. Baby placed on mom's chest, and attended to by peds.  Cord double clamped after cessation of pulsation, cut by FOB.  Cord blood sample collected   Third Stage: Placenta delivered spont intact with 3 VC @ 1302 Placenta disposition: routine disposal Uterine tone Firm with massage / bleeding moderate - Cytotec PR and Methergine given after 2nd fundal rub.   Stellate 2nd deg laceration identified with midline perineal lac, bilateral sulcus lacerations Anesthesia for repair: epidural Repair began with 2-0 Vicryl CT-1, Dr Feliberto Gottron called to assist after persistent bleeding.  Est. Blood Loss (mL): 700 prior to Dr Feliberto Gottron arrival.     Mom to OR for vaginal repair.  Baby to Couplet care / Skin to Skin with FOB and pts mother.  Newborn: Birth Weight: pending  Apgar Scores: 8/9 Feeding planned: both breast and formula.

## 2019-10-13 ENCOUNTER — Other Ambulatory Visit: Payer: Self-pay

## 2019-10-13 DIAGNOSIS — Z3400 Encounter for supervision of normal first pregnancy, unspecified trimester: Secondary | ICD-10-CM

## 2019-10-16 ENCOUNTER — Other Ambulatory Visit: Payer: Self-pay

## 2019-10-16 ENCOUNTER — Ambulatory Visit
Admission: RE | Admit: 2019-10-16 | Discharge: 2019-10-16 | Disposition: A | Discharge status: Home / Self Care | Payer: Medicaid Other | Source: Ambulatory Visit | Attending: Obstetrics and Gynecology | Admitting: Obstetrics and Gynecology

## 2019-10-16 DIAGNOSIS — Z3402 Encounter for supervision of normal first pregnancy, second trimester: Secondary | ICD-10-CM | POA: Insufficient documentation

## 2019-10-16 DIAGNOSIS — Z3400 Encounter for supervision of normal first pregnancy, unspecified trimester: Secondary | ICD-10-CM

## 2019-10-28 ENCOUNTER — Ambulatory Visit: Payer: Medicaid Other | Admitting: Family Medicine

## 2019-10-28 ENCOUNTER — Encounter: Payer: Self-pay | Admitting: Family Medicine

## 2019-10-28 ENCOUNTER — Other Ambulatory Visit: Payer: Self-pay

## 2019-10-28 DIAGNOSIS — Z3400 Encounter for supervision of normal first pregnancy, unspecified trimester: Secondary | ICD-10-CM

## 2019-10-28 NOTE — Progress Notes (Signed)
In for visit; denies hospital visits; taking PNV Laquida Cotrell, RN  

## 2019-10-28 NOTE — Progress Notes (Signed)
   PRENATAL VISIT NOTE  Subjective:  Jasmin Zimmerman is a 19 y.o. G1P0000 at [redacted]w[redacted]d being seen today for ongoing prenatal care.  She is currently monitored for the following issues for this low-risk pregnancy and has Supervision of normal first pregnancy, antepartum; Nausea/vomiting in pregnancy - 1st trimester, now resolved; History of biliary colic; and Encounter for antenatal screening for chromosomal anomalies on their problem list.  Patient reports no complaints.  Contractions: Not present. Vag. Bleeding: None.  Movement: Present. Denies leaking of fluid/ROM.   The following portions of the patient's history were reviewed and updated as appropriate: allergies, current medications, past family history, past medical history, past social history, past surgical history and problem list. Problem list updated.  Objective:   Vitals:   10/28/19 1456  BP: 121/67  Pulse: (!) 107  Temp: (!) 97.4 F (36.3 C)  Weight: 168 lb (76.2 kg)    Fetal Status: Fetal Heart Rate (bpm): 140 Fundal Height: 20 cm Movement: Present     General:  Alert, oriented and cooperative. Patient is in no acute distress.  Skin: Skin is warm and dry. No rash noted.   Cardiovascular: Normal heart rate noted  Respiratory: Normal respiratory effort, no problems with respiration noted  Abdomen: Soft, gravid, appropriate for gestational age.  Pain/Pressure: Absent     Pelvic: Cervical exam deferred        Extremities: Normal range of motion.  Edema: None  Mental Status: Normal mood and affect. Normal behavior. Normal judgment and thought content.   Assessment and Plan:  Pregnancy: G1P0000 at [redacted]w[redacted]d  1. Supervision of normal first pregnancy, antepartum -Up to date. Discussed recent anatomy US results, wnl.  -Pt wondering about wt gain (TWG = 13 lb), we discussed appropriate wt gain and pt would like referral to MNT for further suggestions. Requests phone appt if possible. -BP cuff prescription written today, advised  to call and change next appt to telehealth if arrives in time. - Amb ref to Medical Nutrition Therapy-MNT     Preterm labor symptoms and general obstetric precautions including but not limited to vaginal bleeding, contractions, leaking of fluid and fetal movement were reviewed in detail with the patient. Please refer to After Visit Summary for other counseling recommendations.  Return in about 4 weeks (around 11/25/2019) for routine prenatal care.  Future Appointments  Date Time Provider Department Center  11/25/2019  3:40 PM [facility] PROVIDER [facility] None    Jasmin Held, PA-C

## 2019-11-05 ENCOUNTER — Ambulatory Visit: Payer: Medicaid Other | Admitting: Dietician

## 2019-11-11 ENCOUNTER — Telehealth: Payer: Self-pay | Admitting: Dietician

## 2019-11-25 ENCOUNTER — Encounter: Payer: Self-pay | Admitting: Family Medicine

## 2019-11-25 ENCOUNTER — Other Ambulatory Visit: Payer: Self-pay

## 2019-11-25 ENCOUNTER — Ambulatory Visit: Payer: Medicaid Other | Admitting: Family Medicine

## 2019-11-25 VITALS — BP 107/65 | HR 85 | Temp 97.9°F | Wt 174.6 lb

## 2019-11-25 DIAGNOSIS — Z3400 Encounter for supervision of normal first pregnancy, unspecified trimester: Secondary | ICD-10-CM

## 2019-11-25 NOTE — Progress Notes (Signed)
Here today for 24.4 week MH RV. Taking PNV QD. Denies ED/hospital visits since last RV. CCNC and PHQ9 forms today. Tawny Hopping, RN

## 2019-11-25 NOTE — Progress Notes (Signed)
  PRENATAL VISIT NOTE  Subjective:  Jasmin Zimmerman Jasmin Zimmerman is a 19 y.o. G1P0000 at [redacted]w[redacted]d being seen today for ongoing prenatal care.  She is currently monitored for the following issues for this low-risk pregnancy and has Supervision of normal first pregnancy, antepartum; Nausea/vomiting in pregnancy - 1st trimester, now resolved; History of biliary colic; and Encounter for antenatal screening for chromosomal anomalies on their problem list.  Patient reports no complaints.  Contractions: Not present. Vag. Bleeding: None.  Movement: Present. Denies leaking of fluid/ROM.   The following portions of the patient's history were reviewed and updated as appropriate: allergies, current medications, past family history, past medical history, past social history, past surgical history and problem list. Problem list updated.  Objective:   Vitals:   11/25/19 1537  BP: 107/65  Pulse: 85  Temp: 97.9 F (36.6 C)  Weight: 174 lb 9.6 oz (79.2 kg)    Fetal Status: Fetal Heart Rate (bpm): 150 Fundal Height: 25 cm Movement: Present     General:  Alert, oriented and cooperative. Patient is in no acute distress.  Skin: Skin is warm and dry. No rash noted.   Cardiovascular: Normal heart rate noted  Respiratory: Normal respiratory effort, no problems with respiration noted  Abdomen: Soft, gravid, appropriate for gestational age.  Pain/Pressure: Absent     Pelvic: Cervical exam deferred        Extremities: Normal range of motion.  Edema: None  Mental Status: Normal mood and affect. Normal behavior. Normal judgment and thought content.   Assessment and Plan:  Pregnancy: G1P0000 at [redacted]w[redacted]d   1. Supervision of normal first pregnancy, antepartum Up to date.  She had MNT appt, went well, got nutrition tips.  Discussed labwork will be done at next visit and thereafter we will move to q2 wk appts until 36 wks.    Preterm labor symptoms and general obstetric precautions including but not limited to vaginal  bleeding, contractions, leaking of fluid and fetal movement were reviewed in detail with the patient. Please refer to After Visit Summary for other counseling recommendations.  Return in about 4 weeks (around 12/23/2019) for routine prenatal care.  Future Appointments  Date Time Provider Department Center  12/23/2019  3:00 PM [facility] PROVIDER [facility] None    Ann Held, PA-C

## 2019-11-25 NOTE — Progress Notes (Signed)
Here today for 24.4 week MH RV.

## 2019-12-23 ENCOUNTER — Ambulatory Visit: Payer: Medicaid Other | Admitting: Family Medicine

## 2019-12-23 ENCOUNTER — Other Ambulatory Visit: Payer: Self-pay

## 2019-12-23 VITALS — BP 123/71 | HR 88 | Temp 98.9°F | Wt 186.2 lb

## 2019-12-23 DIAGNOSIS — Z3400 Encounter for supervision of normal first pregnancy, unspecified trimester: Secondary | ICD-10-CM | POA: Diagnosis not present

## 2019-12-23 DIAGNOSIS — Z23 Encounter for immunization: Secondary | ICD-10-CM | POA: Diagnosis not present

## 2019-12-23 DIAGNOSIS — O99013 Anemia complicating pregnancy, third trimester: Secondary | ICD-10-CM

## 2019-12-23 LAB — HEMOGLOBIN, FINGERSTICK: Hemoglobin: 8.8 g/dL — ABNORMAL LOW (ref 11.1–15.9)

## 2019-12-23 LAB — OB RESULTS CONSOLE HIV ANTIBODY (ROUTINE TESTING): HIV: NONREACTIVE

## 2019-12-23 MED ORDER — FERROUS SULFATE 324 (65 FE) MG PO TBEC: 1.0000 | DELAYED_RELEASE_TABLET | Freq: Three times a day (TID) | ORAL | 0 refills | Status: DC

## 2019-12-23 NOTE — Progress Notes (Signed)
   PRENATAL VISIT NOTE  Subjective:  Jasmin Zimmerman is a 19 y.o. G1P0000 at [redacted]w[redacted]d being seen today for ongoing prenatal care.  She is currently monitored for the following issues for this low-risk pregnancy and has Supervision of normal first pregnancy, antepartum; Nausea/vomiting in pregnancy - 1st trimester, now resolved; History of biliary colic; and Encounter for antenatal screening for chromosomal anomalies on their problem list.  Patient reports no complaints.  Contractions: Not present. Vag. Bleeding: None.  Movement: Present. Denies leaking of fluid/ROM.   The following portions of the patient's history were reviewed and updated as appropriate: allergies, current medications, past family history, past medical history, past social history, past surgical history and problem list. Problem list updated.  Objective:   Vitals:   12/23/19 1534  BP: 123/71  Pulse: 88  Temp: 98.9 F (37.2 C)  Weight: 186 lb 3.2 oz (84.5 kg)    Fetal Status: Fetal Heart Rate (bpm): 145 Fundal Height: 29 cm Movement: Present     General:  Alert, oriented and cooperative. Patient is in no acute distress.  Skin: Skin is warm and dry. No rash noted.   Cardiovascular: Normal heart rate noted  Respiratory: Normal respiratory effort, no problems with respiration noted  Abdomen: Soft, gravid, appropriate for gestational age.  Pain/Pressure: Absent     Pelvic: Cervical exam deferred        Extremities: Normal range of motion.  Edema: None  Mental Status: Normal mood and affect. Normal behavior. Normal judgment and thought content.   Assessment and Plan:  Pregnancy: G1P0000 at [redacted]w[redacted]d  1. Supervision of normal first pregnancy, antepartum Reviewed pregnancy care today Currently up to date - HIV [place] [facility] - RPR - Glucose tolerance, 1 hour - Tdap vaccine greater than or equal to 7yo IM - Hemoglobin, fingerstick  2. Need for Tdap vaccination - Tdap vaccine greater than or equal to 7yo IM  3.  Anemia affecting pregnancy in third trimester Very anemic on POC hgb. Started on Fe per standing Repeat hgb in 4 wks Low threshold for referral to heme for fe infusion.  - Fe+CBC/D/Plt+TIBC+Fer+Retic - ferrous sulfate 324 (65 Fe) MG TBEC; Take 1 tablet (325 mg total) by mouth in the morning, at noon, and at bedtime.  Dispense: 100 tablet; Refill: 0   Preterm labor symptoms and general obstetric precautions including but not limited to vaginal bleeding, contractions, leaking of fluid and fetal movement were reviewed in detail with the patient. Please refer to After Visit Summary for other counseling recommendations.  Return in about 2 weeks (around 01/06/2020) for Routine prenatal care.  Future Appointments  Date Time Provider Department Center  01/06/2020  3:40 PM [facility] PROVIDER [facility] None    Federico Flake, MD

## 2019-12-23 NOTE — Progress Notes (Signed)
Patient Hgb 8.8 today, given ferrous sulfate and anemia pamphlet. Anemia profile ordered. Patient needs Hgb check in 4 weeks.Burt Knack, RN

## 2019-12-23 NOTE — Progress Notes (Signed)
Tdap given, right deltoid, tolerated well, VIS given..Thomasine Klutts Brewer-Jensen, RN 

## 2019-12-23 NOTE — Progress Notes (Signed)
Patient arrived late,  here for MH RV at 28 4/7. 28 week labs and Tdap today. Patient states she ran out of PNV and ordered some online. Needs to be to lab around 4:35 for blood draw at 4:44pm..Frida Wahlstrom Babs Sciara, RN

## 2019-12-24 LAB — FE+CBC/D/PLT+TIBC+FER+RETIC
Basophils Absolute: 0 10*3/uL (ref 0.0–0.2)
Basos: 0 %
EOS (ABSOLUTE): 0 10*3/uL (ref 0.0–0.4)
Eos: 0 %
Ferritin: 4 ng/mL — ABNORMAL LOW (ref 15–150)
Hematocrit: 27.8 % — ABNORMAL LOW (ref 34.0–46.6)
Hemoglobin: 8.8 g/dL — ABNORMAL LOW (ref 11.1–15.9)
Immature Grans (Abs): 0.1 10*3/uL (ref 0.0–0.1)
Immature Granulocytes: 1 %
Iron Saturation: 3 % — CL (ref 15–55)
Iron: 18 ug/dL — ABNORMAL LOW (ref 27–159)
Lymphocytes Absolute: 2 10*3/uL (ref 0.7–3.1)
Lymphs: 22 %
MCH: 23.4 pg — ABNORMAL LOW (ref 26.6–33.0)
MCHC: 31.7 g/dL (ref 31.5–35.7)
MCV: 74 fL — ABNORMAL LOW (ref 79–97)
Monocytes Absolute: 0.7 10*3/uL (ref 0.1–0.9)
Monocytes: 8 %
Neutrophils Absolute: 6.3 10*3/uL (ref 1.4–7.0)
Neutrophils: 69 %
Platelets: 426 10*3/uL (ref 150–450)
RBC: 3.76 x10E6/uL — ABNORMAL LOW (ref 3.77–5.28)
RDW: 14.5 % (ref 11.7–15.4)
Retic Ct Pct: 1.8 % (ref 0.6–2.6)
Total Iron Binding Capacity: 516 ug/dL — ABNORMAL HIGH (ref 250–450)
UIBC: 498 ug/dL — ABNORMAL HIGH (ref 131–425)
WBC: 9.1 10*3/uL (ref 3.4–10.8)

## 2019-12-24 LAB — RPR: RPR Ser Ql: NONREACTIVE

## 2019-12-24 LAB — GLUCOSE, 1 HOUR GESTATIONAL: Gestational Diabetes Screen: 57 mg/dL — ABNORMAL LOW (ref 65–139)

## 2019-12-25 ENCOUNTER — Telehealth: Payer: Self-pay

## 2019-12-25 NOTE — Telephone Encounter (Signed)
Per Dr. Linden Dolin client & stressed importance of taking Fe TID with juice since anemia panel indicated is  Fe deficient-states is taking as directed Sharlette Dense, RN

## 2020-01-06 ENCOUNTER — Ambulatory Visit: Payer: Medicaid Other | Admitting: Family Medicine

## 2020-01-06 ENCOUNTER — Other Ambulatory Visit: Payer: Self-pay

## 2020-01-06 ENCOUNTER — Ambulatory Visit: Payer: Medicaid Other

## 2020-01-06 VITALS — BP 116/63 | HR 93 | Temp 98.5°F | Wt 190.0 lb

## 2020-01-06 DIAGNOSIS — Z3400 Encounter for supervision of normal first pregnancy, unspecified trimester: Secondary | ICD-10-CM

## 2020-01-06 DIAGNOSIS — O99013 Anemia complicating pregnancy, third trimester: Secondary | ICD-10-CM

## 2020-01-06 LAB — HEMOGLOBIN, FINGERSTICK: Hemoglobin: 9 g/dL — ABNORMAL LOW (ref 11.1–15.9)

## 2020-01-06 NOTE — Progress Notes (Signed)
In for visit; taking PNV; denies hospital visits since last appt; reports only taking Fe x 1 wk due to thinks it makes her "feel bad"; informed will have recheck Hgb today; undecided on [facility] Sharlette Dense, RN Hgb 9.0 today-encouraged to take Fe 1 tab po TID with juice Sharlette Dense, RN

## 2020-01-06 NOTE — Progress Notes (Signed)
  PRENATAL VISIT NOTE  Subjective:  Jasmin Zimmerman is a 19 y.o. G1P0000 at [redacted]w[redacted]d being seen today for ongoing prenatal care.  She is currently monitored for the following issues for this low-risk pregnancy and has Supervision of normal first pregnancy, antepartum; Nausea/vomiting in pregnancy - 1st trimester, now resolved; History of biliary colic; and Encounter for antenatal screening for chromosomal anomalies on their problem list.  Patient reports feeling weak after starting the iron.  Contractions: Not present. Vag. Bleeding: None.  Movement: Present. Denies leaking of fluid/ROM.   The following portions of the patient's history were reviewed and updated as appropriate: allergies, current medications, past family history, past medical history, past social history, past surgical history and problem list. Problem list updated.  Objective:   Vitals:   01/06/20 1525  BP: 116/63  Pulse: 93  Temp: 98.5 F (36.9 C)  Weight: 190 lb (86.2 kg)    Fetal Status: Fetal Heart Rate (bpm): 140 Fundal Height: 31 cm Movement: Present     General:  Alert, oriented and cooperative. Patient is in no acute distress.  Skin: Skin is warm and dry. No rash noted.   Cardiovascular: Normal heart rate noted  Respiratory: Normal respiratory effort, no problems with respiration noted  Abdomen: Soft, gravid, appropriate for gestational age.  Pain/Pressure: Absent     Pelvic: Cervical exam deferred        Extremities: Normal range of motion.  Edema: None  Mental Status: Normal mood and affect. Normal behavior. Normal judgment and thought content.   Assessment and Plan:  Pregnancy: G1P0000 at [redacted]w[redacted]d  1. Supervision of normal first pregnancy, antepartum Up to date currently Reviewed PTL Continue to review birth control options  2. Anemia affecting pregnancy in third trimester Last hgb was quite low (8.8) Discussed sx and need for recheck today If not improving plan for referral to hematology for  possible fe infusion.    Preterm labor symptoms and general obstetric precautions including but not limited to vaginal bleeding, contractions, leaking of fluid and fetal movement were reviewed in detail with the patient. Please refer to After Visit Summary for other counseling recommendations.  Return in about 2 weeks (around 01/20/2020) for Routine prenatal care.  No future appointments.  Federico Flake, MD

## 2020-01-20 ENCOUNTER — Ambulatory Visit: Payer: Medicaid Other

## 2020-01-22 ENCOUNTER — Ambulatory Visit: Payer: Medicaid Other | Admitting: Physician Assistant

## 2020-01-22 ENCOUNTER — Other Ambulatory Visit: Payer: Self-pay

## 2020-01-22 ENCOUNTER — Encounter: Payer: Self-pay | Admitting: Physician Assistant

## 2020-01-22 VITALS — BP 112/66 | HR 92 | Temp 98.3°F | Wt 199.6 lb

## 2020-01-22 DIAGNOSIS — O99013 Anemia complicating pregnancy, third trimester: Secondary | ICD-10-CM

## 2020-01-22 DIAGNOSIS — Z3400 Encounter for supervision of normal first pregnancy, unspecified trimester: Secondary | ICD-10-CM

## 2020-01-22 DIAGNOSIS — O99019 Anemia complicating pregnancy, unspecified trimester: Secondary | ICD-10-CM | POA: Insufficient documentation

## 2020-01-22 HISTORY — DX: Anemia complicating pregnancy, third trimester: O99.013

## 2020-01-22 NOTE — Progress Notes (Signed)
Patient here for MH RV at 32 6/7. Taking iron once or twice each day because it makes her feel weird. States she is eating iron rich foods from anemia pamphlet also. Kick counts reviewed today and cards given. Needs Hgb at next appointment.Burt Knack, RN

## 2020-01-22 NOTE — Progress Notes (Signed)
   PRENATAL VISIT NOTE  Subjective:  Jasmin Zimmerman is a 19 y.o. G1P0000 at [redacted]w[redacted]d being seen today for ongoing prenatal care.  She is currently monitored for the following issues for this high-risk pregnancy and has Supervision of normal first pregnancy, antepartum; Nausea/vomiting in pregnancy - 1st trimester, now resolved; History of biliary colic; Encounter for antenatal screening for chromosomal anomalies; and Anemia affecting pregnancy in third trimester on their problem list.  Patient reports swelling of feet if she has been standing/walking a lot. No edema first thing in morning..  Contractions: Not present. Vag. Bleeding: None.  Movement: Present. Denies leaking of fluid/ROM.   The following portions of the patient's history were reviewed and updated as appropriate: allergies, current medications, past family history, past medical history, past social history, past surgical history and problem list. Problem list updated.  Objective:   Vitals:   01/22/20 1359  BP: 112/66  Pulse: 92  Temp: 98.3 F (36.8 C)  Weight: 199 lb 9.6 oz (90.5 kg)    Fetal Status: Fetal Heart Rate (bpm): 128 Fundal Height: 33 cm Movement: Present     General:  Alert, oriented and cooperative. Patient is in no acute distress.  Skin: Skin is warm and dry. No rash noted.   Cardiovascular: Normal heart rate noted  Respiratory: Normal respiratory effort, no problems with respiration noted  Abdomen: Soft, gravid, appropriate for gestational age.  Pain/Pressure: Absent     Pelvic: Cervical exam deferred        Extremities: Normal range of motion.  Edema: Mild pitting, slight indentation  Mental Status: Normal mood and affect. Normal behavior. Normal judgment and thought content.   Assessment and Plan:  Pregnancy: G1P0000 at [redacted]w[redacted]d  1. Supervision of normal first pregnancy, antepartum Reassurance re: pedal edema if no elevation in BP/HA/proteinuria. Enc to elevate feet prn.  2. Anemia affecting  pregnancy in third trimester Enc to try to take oral iron tid (taking 1-2 times a day because it makes her feel "funny.") Refer to Heme/Onc for possible IV iron therapy. - Ambulatory referral to Hematology   Preterm labor symptoms and general obstetric precautions including but not limited to vaginal bleeding, contractions, leaking of fluid and fetal movement were reviewed in detail with the patient. Please refer to After Visit Summary for other counseling recommendations.  Return in about 2 weeks (around 02/05/2020) for Routine prenatal care.  Future Appointments  Date Time Provider Department Center  02/09/2020  3:00 PM [facility] PROVIDER [facility] None    Landry Dyke, PA-C

## 2020-01-24 NOTE — Progress Notes (Signed)
[place]  [phone] [phone]) [phone]  ID: Rosann Auerbach OB: [birth year]  MR#: [id number]  XTK#:[id number]  Patient Care Team: [facility] [place] as PCP - General (General Practice)  CHIEF COMPLAINT: Anemia affecting pregnancy in the third trimester.  INTERVAL HISTORY: Patient is a 19 year old female who was in her third trimester of pregnancy who was noted to have declining hemoglobin as well as decreased iron stores.  She currently feels well and is asymptomatic.  She has no neurologic complaints.  She denies any recent fevers or illnesses.  She is gaining weight appropriately.  She has no chest pain, shortness of breath, cough, or hemoptysis.  She denies any nausea, vomiting, constipation, or diarrhea.  She has no melena or hematochezia.  She has no urinary complaints.  Patient offers no specific complaints today.  REVIEW OF SYSTEMS:   Review of Systems  Constitutional: Negative.  Negative for fever, malaise/fatigue and weight loss.  Respiratory: Negative.  Negative for cough and shortness of breath.   Cardiovascular: Negative.  Negative for chest pain and leg swelling.  Gastrointestinal: Negative.  Negative for abdominal pain, blood in stool and melena.  Genitourinary: Negative.  Negative for hematuria.  Musculoskeletal: Negative.   Skin: Negative.  Negative for rash.  Neurological: Negative.  Negative for dizziness, focal weakness, weakness and headaches.  Psychiatric/Behavioral: Negative.  The patient is not nervous/anxious.     As per HPI. Otherwise, a complete review of systems is negative.  PAST MEDICAL HISTORY: Past Medical History:  Diagnosis Date  . Anemia affecting pregnancy in third trimester 01/22/2020  . Biliary colic 99/24/2683   @ ED-small gallstones seem    PAST SURGICAL HISTORY: Past Surgical History:  Procedure Laterality Date  . left shoulder surgery Left    Left shoulder surgery in middle school,  unsure date and reason  . WISDOM TOOTH EXTRACTION Bilateral 2018    FAMILY HISTORY: Family History  Problem Relation Age of Onset  . Diabetes Maternal Uncle   . Diabetes Maternal Grandmother   . Vision loss Maternal Grandmother   . Diabetes Maternal Grandfather   . Kidney disease Maternal Grandfather     ADVANCED DIRECTIVES (Y/N):  N  HEALTH MAINTENANCE: Social History   Tobacco Use  . Smoking status: Never Smoker  . Smokeless tobacco: Never Used  Substance Use Topics  . Alcohol use: Never  . Drug use: Never     Colonoscopy:  PAP:  Bone density:  Lipid panel:  No Known Allergies  Current Outpatient Medications  Medication Sig Dispense Refill  . acetaminophen (TYLENOL) 325 MG tablet Take 325 mg by mouth every 6 (six) hours as needed for headache.    . calcium carbonate (TUMS - DOSED IN MG ELEMENTAL CALCIUM) 500 MG chewable tablet Chew 1 tablet by mouth daily.    . Prenatal Vit-Fe Fumarate-FA (MULTIVITAMIN-PRENATAL) 27-0.8 MG TABS tablet Take 1 tablet by mouth daily at 12 noon.    . ferrous sulfate 324 (65 Fe) MG TBEC Take 1 tablet (325 mg total) by mouth in the morning, at noon, and at bedtime. 100 tablet 0   No current facility-administered medications for this visit.    OBJECTIVE: Vitals:   01/26/20 1353  BP: 105/62  Pulse: 77  Resp: 20  Temp: 97.7 F (36.5 C)     Body mass index is 37.25 kg/m.    ECOG FS:0 - Asymptomatic  General: Well-developed, well-nourished, no acute distress. Eyes: Pink conjunctiva, anicteric sclera. HEENT: Normocephalic, moist mucous membranes.  Lungs: No audible wheezing or coughing. Heart: Regular rate and rhythm. Abdomen: Appears appropriate for gestational age. Musculoskeletal: No edema, cyanosis, or clubbing. Neuro: Alert, answering all questions appropriately. Cranial nerves grossly intact. Skin: No rashes or petechiae noted. Psych: Normal affect. Lymphatics: No cervical, calvicular, axillary or inguinal LAD.   LAB  RESULTS:  Lab Results  Component Value Date   NA 133 (L) 09/03/2019   K 3.2 (L) 09/03/2019   CL 101 09/03/2019   CO2 21 (L) 09/03/2019   GLUCOSE 122 (H) 09/03/2019   BUN 8 09/03/2019   CREATININE 0.49 09/03/2019   CALCIUM 9.2 09/03/2019   PROT 7.8 09/03/2019   ALBUMIN 4.0 09/03/2019   AST 30 09/03/2019   ALT 30 09/03/2019   ALKPHOS 66 09/03/2019   BILITOT 0.5 09/03/2019   GFRNONAA >60 09/03/2019   GFRAA >60 09/03/2019    Lab Results  Component Value Date   WBC 7.6 01/26/2020   NEUTROABS 5.3 01/26/2020   HGB 8.8 (L) 01/26/2020   HCT 28.0 (L) 01/26/2020   MCV 73.7 (L) 01/26/2020   PLT 391 01/26/2020   Lab Results  Component Value Date   IRON 15 (L) 01/26/2020   TIBC 511 (H) 01/26/2020   IRONPCTSAT 3 (L) 01/26/2020   Lab Results  Component Value Date   FERRITIN 4 (L) 01/26/2020     STUDIES: No results found.  ASSESSMENT: Anemia affecting pregnancy in the third trimester.  PLAN:    1. Anemia affecting pregnancy in the third trimester: Patient's hemoglobin and iron stores are significantly decreased.  She reports she cannot tolerate oral iron supplementation.  Return to clinic later this week and next week to receive 510 mg IV Feraheme.  Patient will then return to clinic first week of June prior to her due date for repeat laboratory work and further evaluation. 2.  Pregnancy: Patient reports her due date is approximately March 12, 2020.  I spent a total of 45 minutes reviewing chart data, face-to-face evaluation with the patient, counseling and coordination of care as detailed above.   Patient expressed understanding and was in agreement with this plan. She also understands that She can call clinic at any time with any questions, concerns, or complaints.    Jeralyn Ruths, MD   01/26/2020 4:03 PM

## 2020-01-26 ENCOUNTER — Inpatient Hospital Stay: Payer: Medicaid Other | Attending: Oncology | Admitting: Oncology

## 2020-01-26 ENCOUNTER — Inpatient Hospital Stay: Payer: Medicaid Other

## 2020-01-26 ENCOUNTER — Other Ambulatory Visit: Payer: Self-pay

## 2020-01-26 ENCOUNTER — Encounter: Payer: Self-pay | Admitting: Oncology

## 2020-01-26 VITALS — BP 105/62 | HR 77 | Temp 97.7°F | Resp 20 | Wt 200.4 lb

## 2020-01-26 DIAGNOSIS — D649 Anemia, unspecified: Secondary | ICD-10-CM | POA: Insufficient documentation

## 2020-01-26 DIAGNOSIS — O99013 Anemia complicating pregnancy, third trimester: Secondary | ICD-10-CM

## 2020-01-26 LAB — CBC WITH DIFFERENTIAL/PLATELET
Abs Immature Granulocytes: 0.05 10*3/uL (ref 0.00–0.07)
Basophils Absolute: 0 10*3/uL (ref 0.0–0.1)
Basophils Relative: 0 %
Eosinophils Absolute: 0 10*3/uL (ref 0.0–0.5)
Eosinophils Relative: 1 %
HCT: 28 % — ABNORMAL LOW (ref 36.0–46.0)
Hemoglobin: 8.8 g/dL — ABNORMAL LOW (ref 12.0–15.0)
Immature Granulocytes: 1 %
Lymphocytes Relative: 23 %
Lymphs Abs: 1.8 10*3/uL (ref 0.7–4.0)
MCH: 23.2 pg — ABNORMAL LOW (ref 26.0–34.0)
MCHC: 31.4 g/dL (ref 30.0–36.0)
MCV: 73.7 fL — ABNORMAL LOW (ref 80.0–100.0)
Monocytes Absolute: 0.5 10*3/uL (ref 0.1–1.0)
Monocytes Relative: 7 %
Neutro Abs: 5.3 10*3/uL (ref 1.7–7.7)
Neutrophils Relative %: 68 %
Platelets: 391 10*3/uL (ref 150–400)
RBC: 3.8 MIL/uL — ABNORMAL LOW (ref 3.87–5.11)
RDW: 18 % — ABNORMAL HIGH (ref 11.5–15.5)
WBC: 7.6 10*3/uL (ref 4.0–10.5)
nRBC: 0.4 % — ABNORMAL HIGH (ref 0.0–0.2)

## 2020-01-26 LAB — IRON AND TIBC
Iron: 15 ug/dL — ABNORMAL LOW (ref 28–170)
Saturation Ratios: 3 % — ABNORMAL LOW (ref 10.4–31.8)
TIBC: 511 ug/dL — ABNORMAL HIGH (ref 250–450)
UIBC: 496 ug/dL

## 2020-01-26 LAB — VITAMIN B12: Vitamin B-12: 124 pg/mL — ABNORMAL LOW (ref 180–914)

## 2020-01-26 LAB — FOLATE: Folate: 15 ng/mL (ref >5.9)

## 2020-01-26 LAB — FERRITIN: Ferritin: 4 ng/mL — ABNORMAL LOW (ref 11–307)

## 2020-01-27 ENCOUNTER — Other Ambulatory Visit: Payer: Self-pay | Admitting: Oncology

## 2020-01-27 DIAGNOSIS — O99013 Anemia complicating pregnancy, third trimester: Secondary | ICD-10-CM

## 2020-01-29 ENCOUNTER — Other Ambulatory Visit: Payer: Self-pay

## 2020-01-29 ENCOUNTER — Inpatient Hospital Stay: Payer: Medicaid Other

## 2020-01-29 VITALS — BP 109/62 | HR 75 | Temp 98.0°F | Resp 20

## 2020-01-29 DIAGNOSIS — O99013 Anemia complicating pregnancy, third trimester: Secondary | ICD-10-CM

## 2020-01-29 MED ORDER — SODIUM CHLORIDE 0.9 % IV SOLN
Freq: Once | INTRAVENOUS | Status: AC
  Filled 2020-01-29: qty 250

## 2020-01-29 MED ORDER — CYANOCOBALAMIN 1000 MCG/ML IJ SOLN
1000.0000 ug | Freq: Once | INTRAMUSCULAR | Status: AC
  Administered 2020-01-29: 1000 ug via INTRAMUSCULAR
  Filled 2020-01-29: qty 1

## 2020-01-29 MED ORDER — SODIUM CHLORIDE 0.9 % IV SOLN
510.0000 mg | Freq: Once | INTRAVENOUS | Status: AC
  Administered 2020-01-29: 510 mg via INTRAVENOUS
  Filled 2020-01-29: qty 510

## 2020-02-05 ENCOUNTER — Other Ambulatory Visit: Payer: Self-pay

## 2020-02-05 ENCOUNTER — Inpatient Hospital Stay: Payer: Medicaid Other | Attending: Oncology

## 2020-02-05 VITALS — BP 111/53 | HR 62 | Temp 97.0°F | Resp 18

## 2020-02-05 DIAGNOSIS — D649 Anemia, unspecified: Secondary | ICD-10-CM | POA: Insufficient documentation

## 2020-02-05 DIAGNOSIS — O99013 Anemia complicating pregnancy, third trimester: Secondary | ICD-10-CM | POA: Diagnosis present

## 2020-02-05 MED ORDER — SODIUM CHLORIDE 0.9 % IV SOLN
510.0000 mg | Freq: Once | INTRAVENOUS | Status: AC
  Administered 2020-02-05: 15:00:00 510 mg via INTRAVENOUS
  Filled 2020-02-05: qty 510

## 2020-02-05 MED ORDER — SODIUM CHLORIDE 0.9 % IV SOLN
Freq: Once | INTRAVENOUS | Status: AC
  Filled 2020-02-05: qty 250

## 2020-02-09 ENCOUNTER — Ambulatory Visit: Payer: Medicaid Other

## 2020-02-12 ENCOUNTER — Other Ambulatory Visit: Payer: Self-pay

## 2020-02-12 ENCOUNTER — Encounter: Payer: Self-pay | Admitting: Physician Assistant

## 2020-02-12 ENCOUNTER — Ambulatory Visit: Payer: Medicaid Other | Admitting: Physician Assistant

## 2020-02-12 VITALS — BP 108/72 | HR 92 | Temp 98.2°F | Wt 205.6 lb

## 2020-02-12 DIAGNOSIS — Z3400 Encounter for supervision of normal first pregnancy, unspecified trimester: Secondary | ICD-10-CM

## 2020-02-12 DIAGNOSIS — O99013 Anemia complicating pregnancy, third trimester: Secondary | ICD-10-CM

## 2020-02-12 NOTE — Progress Notes (Signed)
   PRENATAL VISIT NOTE  Subjective:  Jasmin Zimmerman is a 19 y.o. G1P0000 at [redacted]w[redacted]d being seen today for ongoing prenatal care.  She is currently monitored for the following issues for this high-risk pregnancy and has Supervision of normal first pregnancy, antepartum; Nausea/vomiting in pregnancy - 1st trimester, now resolved; History of biliary colic; Encounter for antenatal screening for chromosomal anomalies; and Anemia affecting pregnancy in third trimester on their problem list.  Patient reports feet swell occ after prolonged activity.  Contractions: Not present. Vag. Bleeding: None.  Movement: Present. Denies leaking of fluid/ROM.   The following portions of the patient's history were reviewed and updated as appropriate: allergies, current medications, past family history, past medical history, past social history, past surgical history and problem list. Problem list updated.  Objective:   Vitals:   02/12/20 0814  BP: 108/72  Pulse: 92  Temp: 98.2 F (36.8 C)  Weight: 205 lb 9.6 oz (93.3 kg)    Fetal Status: Fetal Heart Rate (bpm): 136 Fundal Height: 35 cm Movement: Present     General:  Alert, oriented and cooperative. Patient is in no acute distress.  Skin: Skin is warm and dry. No rash noted.   Cardiovascular: Normal heart rate noted  Respiratory: Normal respiratory effort, no problems with respiration noted  Abdomen: Soft, gravid, appropriate for gestational age.  Pain/Pressure: Absent     Pelvic: Cervical exam deferred        Extremities: Normal range of motion.  Edema: Trace  Mental Status: Normal mood and affect. Normal behavior. Normal judgment and thought content.   Assessment and Plan:  Pregnancy: G1P0000 at [redacted]w[redacted]d  1. Supervision of normal first pregnancy, antepartum Has all baby items and plan for partner to be at delivery (mom/sister as backup). Anticipatory guidance re: 36-wk labs at RV. Counseled re: excess (50 lb) preg weight gain. Reviewed diet (lots of  "junk", OJ, Gatorade), and counseled re: improvements - seems motivated.  2. Anemia affecting pregnancy in third trimester Hgb at Heme/Onc 8.8 on 4/36, had iron infusion 5/6 (reports no problems with this) with next appt 03/02/20. Stopped oral iron. Enc to restart if tolerated, and keep next IV iron tx appt as sched.   Preterm labor symptoms and general obstetric precautions including but not limited to vaginal bleeding, contractions, leaking of fluid and fetal movement were reviewed in detail with the patient. Please refer to After Visit Summary for other counseling recommendations.  Return in about 1 week (around 02/19/2020) for Routine prenatal care, 36 wk labs.  Future Appointments  Date Time Provider Department Center  02/19/2020  8:20 AM [facility] PROVIDER [facility] None  03/02/2020  1:00 PM [facility] LAB [facility] None  03/02/2020  1:15 PM Orlie Dakin, Tollie Pizza, MD [facility] None  03/02/2020  1:45 PM [facility] INFUSION CHAIR 7 [facility] None    Jasmin Doane, PA-C

## 2020-02-12 NOTE — Progress Notes (Signed)
In for visit; had hematology visits with Fe infusion; f/u Hem appt 03/02/20; taking PNV, no longer taking Fe; discussed labs needed @ next visit Sharlette Dense, RN

## 2020-02-19 ENCOUNTER — Encounter: Payer: Self-pay | Admitting: Physician Assistant

## 2020-02-19 ENCOUNTER — Other Ambulatory Visit: Payer: Self-pay

## 2020-02-19 ENCOUNTER — Ambulatory Visit: Payer: Medicaid Other | Admitting: Physician Assistant

## 2020-02-19 VITALS — BP 106/64 | HR 80 | Temp 97.5°F | Wt 209.2 lb

## 2020-02-19 DIAGNOSIS — Z3400 Encounter for supervision of normal first pregnancy, unspecified trimester: Secondary | ICD-10-CM | POA: Diagnosis not present

## 2020-02-19 DIAGNOSIS — O99013 Anemia complicating pregnancy, third trimester: Secondary | ICD-10-CM

## 2020-02-19 NOTE — Progress Notes (Signed)
In for visit; denies hospital visits since last appt; declines self collection of cultures; taking PNV Sharlette Dense, RN

## 2020-02-19 NOTE — Progress Notes (Signed)
   PRENATAL VISIT NOTE  Subjective:  Jasmin Zimmerman is a 19 y.o. G1P0000 at [redacted]w[redacted]d being seen today for ongoing prenatal care.  She is currently monitored for the following issues for this high-risk pregnancy and has Supervision of normal first pregnancy, antepartum; Nausea/vomiting in pregnancy - 1st trimester, now resolved; History of biliary colic; Encounter for antenatal screening for chromosomal anomalies; and Anemia affecting pregnancy in third trimester on their problem list.  Patient reports no complaints.  Contractions: Not present. Vag. Bleeding: None.  Movement: Present. Denies leaking of fluid/ROM.   The following portions of the patient's history were reviewed and updated as appropriate: allergies, current medications, past family history, past medical history, past social history, past surgical history and problem list. Problem list updated.  Objective:   Vitals:   02/19/20 0810  BP: 106/64  Pulse: 80  Temp: (!) 97.5 F (36.4 C)  Weight: 209 lb 3.2 oz (94.9 kg)    Fetal Status: Fetal Heart Rate (bpm): 140 Fundal Height: 37 cm Movement: Present     General:  Alert, oriented and cooperative. Patient is in no acute distress.  Skin: Skin is warm and dry. No rash noted.   Cardiovascular: Normal heart rate noted  Respiratory: Normal respiratory effort, no problems with respiration noted  Abdomen: Soft, gravid, appropriate for gestational age.  Pain/Pressure: Absent     Pelvic: Cervical exam deferred        Extremities: Normal range of motion.  Edema: Mild pitting, slight indentation  Mental Status: Normal mood and affect. Normal behavior. Normal judgment and thought content.   Assessment and Plan:  Pregnancy: G1P0000 at [redacted]w[redacted]d  1. Supervision of normal first pregnancy, antepartum Has infant carseat. Routine testing today. PHQ-9 score = 0. - GBS Culture - Chlamydia/GC NAA, Confirmation  2. Anemia affecting pregnancy in third trimester Enc to keep appt as sched 03/02/20  for 3rd of 3 IV iron infusions. Pt tolerated others well.  Term labor symptoms and general obstetric precautions including but not limited to vaginal bleeding, contractions, leaking of fluid and fetal movement were reviewed in detail with the patient. Please refer to After Visit Summary for other counseling recommendations.  Return in about 1 week (around 02/26/2020) for Routine prenatal care.  Future Appointments  Date Time Provider Department Center  03/02/2020  1:00 PM [facility] None  03/02/2020  1:15 PM Orlie Dakin, Tollie Pizza, MD [facility] None  03/02/2020  1:45 PM [facility] INFUSION CHAIR 7 [facility] None    Cartez Mogle, PA-C

## 2020-02-21 LAB — CHLAMYDIA/GC NAA, CONFIRMATION
Chlamydia trachomatis, NAA: NEGATIVE
Neisseria gonorrhoeae, NAA: NEGATIVE

## 2020-02-23 LAB — CULTURE, BETA STREP (GROUP B ONLY): Strep Gp B Culture: NEGATIVE

## 2020-02-27 ENCOUNTER — Ambulatory Visit: Payer: Medicaid Other

## 2020-02-27 ENCOUNTER — Ambulatory Visit: Payer: Medicaid Other | Admitting: Family Medicine

## 2020-02-27 ENCOUNTER — Other Ambulatory Visit: Payer: Self-pay

## 2020-02-27 VITALS — BP 106/66 | HR 87 | Temp 98.8°F | Wt 211.0 lb

## 2020-02-27 DIAGNOSIS — Z3400 Encounter for supervision of normal first pregnancy, unspecified trimester: Secondary | ICD-10-CM

## 2020-02-27 DIAGNOSIS — O99013 Anemia complicating pregnancy, third trimester: Secondary | ICD-10-CM

## 2020-02-27 LAB — HEMOGLOBIN, FINGERSTICK: Hemoglobin: 12 g/dL (ref 11.1–15.9)

## 2020-02-27 NOTE — Progress Notes (Signed)
[place]  [phone]) [phone] Fax:(336) [phone]  ID: Rosann Auerbach OB: 28-Nov-2000  MR#: [id number]  RXV#:[id number]  Patient Care Team: [facility] [place] as PCP - General (General Practice)  CHIEF COMPLAINT: Anemia affecting pregnancy in the third trimester.  INTERVAL HISTORY: Patient returns to clinic today for repeat laboratory work, further evaluation, and consideration of additional IV Feraheme.  She continues to feel well and is asymptomatic.  She has no neurologic complaints.  She denies any recent fevers or illnesses.  She is gaining weight appropriately.  She has no chest pain, shortness of breath, cough, or hemoptysis.  She denies any nausea, vomiting, constipation, or diarrhea.  She has no melena or hematochezia.  She has no urinary complaints.  Patient offers no specific complaints today.  REVIEW OF SYSTEMS:   Review of Systems  Constitutional: Negative.  Negative for fever, malaise/fatigue and weight loss.  Respiratory: Negative.  Negative for cough and shortness of breath.   Cardiovascular: Negative.  Negative for chest pain and leg swelling.  Gastrointestinal: Negative.  Negative for abdominal pain, blood in stool and melena.  Genitourinary: Negative.  Negative for hematuria.  Musculoskeletal: Negative.   Skin: Negative.  Negative for rash.  Neurological: Negative.  Negative for dizziness, focal weakness, weakness and headaches.  Psychiatric/Behavioral: Negative.  The patient is not nervous/anxious.     As per HPI. Otherwise, a complete review of systems is negative.  PAST MEDICAL HISTORY: Past Medical History:  Diagnosis Date  . Anemia affecting pregnancy in third trimester 01/22/2020  . Biliary colic 50/93/2671   @ ED-small gallstones seem    PAST SURGICAL HISTORY: Past Surgical History:  Procedure Laterality Date  . left shoulder surgery Left    Left shoulder surgery in middle school, unsure date and reason    . WISDOM TOOTH EXTRACTION Bilateral 2018    FAMILY HISTORY: Family History  Problem Relation Age of Onset  . Diabetes Maternal Uncle   . Diabetes Maternal Grandmother   . Vision loss Maternal Grandmother   . Diabetes Maternal Grandfather   . Kidney disease Maternal Grandfather     ADVANCED DIRECTIVES (Y/N):  N  HEALTH MAINTENANCE: Social History   Tobacco Use  . Smoking status: Never Smoker  . Smokeless tobacco: Never Used  Substance Use Topics  . Alcohol use: Never  . Drug use: Never     Colonoscopy:  PAP:  Bone density:  Lipid panel:  No Known Allergies  Current Outpatient Medications  Medication Sig Dispense Refill  . acetaminophen (TYLENOL) 325 MG tablet Take 325 mg by mouth every 6 (six) hours as needed for headache.    . Prenatal Vit-Fe Fumarate-FA (MULTIVITAMIN-PRENATAL) 27-0.8 MG TABS tablet Take 1 tablet by mouth daily at 12 noon.    . calcium carbonate (TUMS - DOSED IN MG ELEMENTAL CALCIUM) 500 MG chewable tablet Chew 1 tablet by mouth daily.    . ferrous sulfate 324 (65 Fe) MG TBEC Take 1 tablet (325 mg total) by mouth in the morning, at noon, and at bedtime. 100 tablet 0   No current facility-administered medications for this visit.    OBJECTIVE: Vitals:   03/02/20 1319  BP: 116/65  Pulse: 69  Resp: 20  Temp: (!) 97.5 F (36.4 C)  SpO2: 100%     Body mass index is 40 kg/m.    ECOG FS:0 - Asymptomatic  General: Well-developed, well-nourished, no acute distress. Eyes: Pink conjunctiva, anicteric sclera. HEENT: Normocephalic, moist mucous membranes. Lungs: No audible  wheezing or coughing. Heart: Regular rate and rhythm. Abdomen: Appears appropriate for gestational age. Musculoskeletal: No edema, cyanosis, or clubbing. Neuro: Alert, answering all questions appropriately. Cranial nerves grossly intact. Skin: No rashes or petechiae noted. Psych: Normal affect.    LAB RESULTS:  Lab Results  Component Value Date   NA 133 (L) 09/03/2019    K 3.2 (L) 09/03/2019   CL 101 09/03/2019   CO2 21 (L) 09/03/2019   GLUCOSE 122 (H) 09/03/2019   BUN 8 09/03/2019   CREATININE 0.49 09/03/2019   CALCIUM 9.2 09/03/2019   PROT 7.8 09/03/2019   ALBUMIN 4.0 09/03/2019   AST 30 09/03/2019   ALT 30 09/03/2019   ALKPHOS 66 09/03/2019   BILITOT 0.5 09/03/2019   GFRNONAA >60 09/03/2019   GFRAA >60 09/03/2019    Lab Results  Component Value Date   WBC 8.1 03/02/2020   NEUTROABS 5.9 03/02/2020   HGB 11.9 (L) 03/02/2020   HCT 34.8 (L) 03/02/2020   MCV 80.7 03/02/2020   PLT 282 03/02/2020   Lab Results  Component Value Date   IRON 81 03/02/2020   TIBC 430 03/02/2020   IRONPCTSAT 19 03/02/2020   Lab Results  Component Value Date   FERRITIN 51 03/02/2020     STUDIES: No results found.  ASSESSMENT: Anemia affecting pregnancy in the third trimester.  PLAN:    1. Anemia affecting pregnancy in the third trimester: Patient's hemoglobin has significantly improved and now nearly within normal limits at 11.9.  Iron stores are within normal limits. She reports she cannot tolerate oral iron supplementation.  She does not require additional IV Feraheme.  Patient last received treatment on January 29, 2020.  Return to clinic in 6 months with repeat laboratory work and video assisted telemedicine visit. 2.  Pregnancy: Patient's due date is the next week on March 12, 2020.  I spent a total of 20 minutes reviewing chart data, face-to-face evaluation with the patient, counseling and coordination of care as detailed above.   Patient expressed understanding and was in agreement with this plan. She also understands that She can call clinic at any time with any questions, concerns, or complaints.    Jeralyn Ruths, MD   03/03/2020 8:19 PM

## 2020-02-27 NOTE — Progress Notes (Signed)
In for visit; taking PNV; denies hospital visits since last appt Candi Profit, RN  

## 2020-02-27 NOTE — Progress Notes (Signed)
Called patient to go over pre screening questions. Patient denied any complications or any pain.

## 2020-02-27 NOTE — Progress Notes (Signed)
PRENATAL VISIT NOTE  Subjective:  Jasmin Zimmerman is a 19 y.o. G1P0000 at [redacted]w[redacted]d being seen today for ongoing prenatal care.  She is currently monitored for the following issues for this low-risk pregnancy and has Supervision of normal first pregnancy, antepartum; Nausea/vomiting in pregnancy - 1st trimester, now resolved; History of biliary colic; Encounter for antenatal screening for chromosomal anomalies; and Anemia affecting pregnancy in third trimester on their problem list.  Patient reports pressure in pevlic/perineum with changes in postion. esp going from laying to standing. intermittent in nature. no associated other sx besires pressure/pain .  Contractions: Not present. Vag. Bleeding: None.  Movement: Present. Denies leaking of fluid/ROM.   The following portions of the patient's history were reviewed and updated as appropriate: allergies, current medications, past family history, past medical history, past social history, past surgical history and problem list. Problem list updated.  Objective:   Vitals:   02/27/20 0810  BP: 106/66  Pulse: 87  Temp: 98.8 F (37.1 C)  Weight: 211 lb (95.7 kg)    Fetal Status: Fetal Heart Rate (bpm): 141 Fundal Height: 38 cm Movement: Present  Presentation: Vertex  General:  Alert, oriented and cooperative. Patient is in no acute distress.  Skin: Skin is warm and dry. No rash noted.   Cardiovascular: Normal heart rate noted  Respiratory: Normal respiratory effort, no problems with respiration noted  Abdomen: Soft, gravid, appropriate for gestational age.  Pain/Pressure: Present     Pelvic: Cervical exam deferred        Extremities: Normal range of motion.  Edema: Mild pitting, slight indentation  Mental Status: Normal mood and affect. Normal behavior. Normal judgment and thought content.   Assessment and Plan:  Pregnancy: G1P0000 at [redacted]w[redacted]d  1. Anemia affecting pregnancy in third trimester Last hgb was 8.8 Received feraheme 4/29 and  5/6 Has appt with heme 6/1 - Hemoglobin, venipuncture  2. Supervision of normal first pregnancy, antepartum Reviewed plan for IOL at 41 wks, will check cervix and request at 39 wks. Delivery will be with [facility] Discussed labor in detail Reviewed visititation policy at hospital Confirmed plan to formula feed   Term labor symptoms and general obstetric precautions including but not limited to vaginal bleeding, contractions, leaking of fluid and fetal movement were reviewed in detail with the patient. Please refer to After Visit Summary for other counseling recommendations.   Return in about 1 week (around 03/05/2020) for Routine prenatal care, in person- schedule IOL.  Future Appointments  Date Time Provider Department Center  03/02/2020  1:00 PM [facility] None  03/02/2020  1:15 PM Orlie Dakin, Tollie Pizza, MD [facility] None  03/02/2020  1:45 PM [facility] INFUSION CHAIR 7 [facility] None  03/09/2020  8:20 AM [facility] PROVIDER [facility] None    Federico Flake, MD

## 2020-03-02 ENCOUNTER — Encounter: Payer: Self-pay | Admitting: Oncology

## 2020-03-02 ENCOUNTER — Inpatient Hospital Stay: Payer: Medicaid Other

## 2020-03-02 ENCOUNTER — Inpatient Hospital Stay (HOSPITAL_BASED_OUTPATIENT_CLINIC_OR_DEPARTMENT_OTHER): Payer: Medicaid Other | Admitting: Oncology

## 2020-03-02 ENCOUNTER — Inpatient Hospital Stay: Payer: Medicaid Other | Attending: Oncology

## 2020-03-02 ENCOUNTER — Other Ambulatory Visit: Payer: Self-pay

## 2020-03-02 VITALS — BP 116/65 | HR 69 | Temp 97.5°F | Resp 20 | Wt 215.2 lb

## 2020-03-02 DIAGNOSIS — O99013 Anemia complicating pregnancy, third trimester: Secondary | ICD-10-CM | POA: Insufficient documentation

## 2020-03-02 DIAGNOSIS — D649 Anemia, unspecified: Secondary | ICD-10-CM | POA: Insufficient documentation

## 2020-03-02 LAB — CBC WITH DIFFERENTIAL/PLATELET
Abs Immature Granulocytes: 0.04 10*3/uL (ref 0.00–0.07)
Basophils Absolute: 0 10*3/uL (ref 0.0–0.1)
Basophils Relative: 0 %
Eosinophils Absolute: 0 10*3/uL (ref 0.0–0.5)
Eosinophils Relative: 0 %
HCT: 34.8 % — ABNORMAL LOW (ref 36.0–46.0)
Hemoglobin: 11.9 g/dL — ABNORMAL LOW (ref 12.0–15.0)
Immature Granulocytes: 1 %
Lymphocytes Relative: 19 %
Lymphs Abs: 1.5 10*3/uL (ref 0.7–4.0)
MCH: 27.6 pg (ref 26.0–34.0)
MCHC: 34.2 g/dL (ref 30.0–36.0)
MCV: 80.7 fL (ref 80.0–100.0)
Monocytes Absolute: 0.6 10*3/uL (ref 0.1–1.0)
Monocytes Relative: 7 %
Neutro Abs: 5.9 10*3/uL (ref 1.7–7.7)
Neutrophils Relative %: 73 %
Platelets: 282 10*3/uL (ref 150–400)
RBC: 4.31 MIL/uL (ref 3.87–5.11)
Smear Review: ADEQUATE
WBC: 8.1 10*3/uL (ref 4.0–10.5)
nRBC: 0 % (ref 0.0–0.2)

## 2020-03-02 LAB — IRON AND TIBC
Iron: 81 ug/dL (ref 28–170)
Saturation Ratios: 19 % (ref 10.4–31.8)
TIBC: 430 ug/dL (ref 250–450)
UIBC: 349 ug/dL

## 2020-03-02 LAB — VITAMIN B12: Vitamin B-12: 85 pg/mL — ABNORMAL LOW (ref 180–914)

## 2020-03-02 LAB — FERRITIN: Ferritin: 51 ng/mL (ref 11–307)

## 2020-03-08 ENCOUNTER — Inpatient Hospital Stay
Admission: EM | Admit: 2020-03-08 | Discharge: 2020-03-11 | DRG: 806 | Disposition: A | Discharge status: Home / Self Care | Payer: Medicaid Other

## 2020-03-08 ENCOUNTER — Other Ambulatory Visit: Payer: Self-pay

## 2020-03-08 ENCOUNTER — Encounter: Payer: Self-pay | Admitting: Obstetrics and Gynecology

## 2020-03-08 ENCOUNTER — Observation Stay
Admission: EM | Admit: 2020-03-08 | Discharge: 2020-03-08 | Disposition: A | Discharge status: Home / Self Care | Payer: Medicaid Other | Source: Home / Self Care | Admitting: Obstetrics and Gynecology

## 2020-03-08 DIAGNOSIS — D62 Acute posthemorrhagic anemia: Secondary | ICD-10-CM | POA: Diagnosis not present

## 2020-03-08 DIAGNOSIS — Z3400 Encounter for supervision of normal first pregnancy, unspecified trimester: Secondary | ICD-10-CM | POA: Diagnosis not present

## 2020-03-08 DIAGNOSIS — Z833 Family history of diabetes mellitus: Secondary | ICD-10-CM | POA: Insufficient documentation

## 2020-03-08 DIAGNOSIS — Z20822 Contact with and (suspected) exposure to covid-19: Secondary | ICD-10-CM | POA: Diagnosis present

## 2020-03-08 DIAGNOSIS — O26893 Other specified pregnancy related conditions, third trimester: Secondary | ICD-10-CM | POA: Diagnosis present

## 2020-03-08 DIAGNOSIS — E669 Obesity, unspecified: Secondary | ICD-10-CM | POA: Diagnosis present

## 2020-03-08 DIAGNOSIS — O99214 Obesity complicating childbirth: Secondary | ICD-10-CM | POA: Diagnosis present

## 2020-03-08 DIAGNOSIS — O9081 Anemia of the puerperium: Secondary | ICD-10-CM | POA: Diagnosis not present

## 2020-03-08 DIAGNOSIS — O4693 Antepartum hemorrhage, unspecified, third trimester: Secondary | ICD-10-CM | POA: Diagnosis present

## 2020-03-08 DIAGNOSIS — O479 False labor, unspecified: Secondary | ICD-10-CM | POA: Diagnosis present

## 2020-03-08 DIAGNOSIS — Z841 Family history of disorders of kidney and ureter: Secondary | ICD-10-CM | POA: Insufficient documentation

## 2020-03-08 DIAGNOSIS — Z3A39 39 weeks gestation of pregnancy: Secondary | ICD-10-CM | POA: Insufficient documentation

## 2020-03-08 DIAGNOSIS — Z820 Family history of epilepsy and other diseases of the nervous system: Secondary | ICD-10-CM | POA: Insufficient documentation

## 2020-03-08 LAB — CBC
HCT: 38 % (ref 36.0–46.0)
Hemoglobin: 12.4 g/dL (ref 12.0–15.0)
MCH: 26.7 pg (ref 26.0–34.0)
MCHC: 32.6 g/dL (ref 30.0–36.0)
MCV: 81.9 fL (ref 80.0–100.0)
Platelets: 307 10*3/uL (ref 150–400)
RBC: 4.64 MIL/uL (ref 3.87–5.11)
WBC: 10.1 10*3/uL (ref 4.0–10.5)
nRBC: 0 % (ref 0.0–0.2)

## 2020-03-08 LAB — TYPE AND SCREEN
ABO/RH(D): O POS
Antibody Screen: NEGATIVE

## 2020-03-08 LAB — SARS CORONAVIRUS 2 BY RT PCR (HOSPITAL ORDER, PERFORMED IN ~~LOC~~ HOSPITAL LAB): SARS Coronavirus 2: NEGATIVE

## 2020-03-08 LAB — ABO/RH: ABO/RH(D): O POS

## 2020-03-08 MED ORDER — OXYTOCIN-SODIUM CHLORIDE 30-0.9 UT/500ML-% IV SOLN
2.5000 [IU]/h | INTRAVENOUS | Status: DC
  Administered 2020-03-09: 2.5 [IU]/h via INTRAVENOUS
  Filled 2020-03-08: qty 1000

## 2020-03-08 MED ORDER — OXYTOCIN 10 UNIT/ML IJ SOLN
INTRAMUSCULAR | Status: AC
  Filled 2020-03-08: qty 2

## 2020-03-08 MED ORDER — ONDANSETRON HCL 4 MG/2ML IJ SOLN: 4.0000 mg | Freq: Four times a day (QID) | INTRAMUSCULAR | Status: DC | PRN

## 2020-03-08 MED ORDER — LIDOCAINE HCL (PF) 1 % IJ SOLN: 30.0000 mL | INTRAMUSCULAR | Status: DC | PRN

## 2020-03-08 MED ORDER — SOD CITRATE-CITRIC ACID 500-334 MG/5ML PO SOLN: 30.0000 mL | ORAL | Status: DC | PRN

## 2020-03-08 MED ORDER — ACETAMINOPHEN 500 MG PO TABS: 1000.0000 mg | ORAL_TABLET | Freq: Four times a day (QID) | ORAL | Status: DC | PRN

## 2020-03-08 MED ORDER — OXYTOCIN BOLUS FROM INFUSION
500.0000 mL | Freq: Once | INTRAVENOUS | Status: AC
  Administered 2020-03-09: 500 mL via INTRAVENOUS

## 2020-03-08 MED ORDER — LACTATED RINGERS IV SOLN: 500.0000 mL | INTRAVENOUS | Status: DC | PRN

## 2020-03-08 MED ORDER — BUTORPHANOL TARTRATE 1 MG/ML IJ SOLN
1.0000 mg | INTRAMUSCULAR | Status: DC | PRN
  Administered 2020-03-08 – 2020-03-09 (×2): 1 mg via INTRAVENOUS
  Filled 2020-03-08 (×2): qty 1

## 2020-03-08 MED ORDER — MISOPROSTOL 200 MCG PO TABS
ORAL_TABLET | ORAL | Status: AC
  Administered 2020-03-09: 800 ug via RECTAL
  Filled 2020-03-08: qty 4

## 2020-03-08 MED ORDER — AMMONIA AROMATIC IN INHA
RESPIRATORY_TRACT | Status: AC
  Filled 2020-03-08: qty 10

## 2020-03-08 MED ORDER — LACTATED RINGERS IV SOLN
INTRAVENOUS | Status: DC
  Administered 2020-03-09: 500 mL via INTRAVENOUS

## 2020-03-08 MED ORDER — LIDOCAINE HCL (PF) 1 % IJ SOLN
INTRAMUSCULAR | Status: AC
  Filled 2020-03-08: qty 30

## 2020-03-08 NOTE — OB Triage Note (Signed)
Pt. Presented to L/D triage from home with complains of intermittent contractions that have increased in frequency and intensity since discharge this morning, rated 8/10. She also reports vaginal pressure with contractions.She reports light pink spotting that began this morning with wiping. No LOF and positive fetal movement stated by pt. VSS. Monitors applied and assessing.

## 2020-03-08 NOTE — Discharge Summary (Signed)
Patient discharged home, discharge instructions given, patient states understanding. Patient left floor in stable condition, denies any other needs at this time. Patient to keep next scheduled OB appointment tomorrow

## 2020-03-08 NOTE — OB Triage Note (Signed)
patient states she woke up this morning at 416am to go to the restroom and when wiping had a small amount of pinkish blood. Pt states she has been having back pain irregularly since yesterday 4/10 pina but was able to go to sleep. Pt denies LOF and states baby is moving well. Denies any other complaints at this time.

## 2020-03-08 NOTE — Discharge Instructions (Signed)
Call your provider for any other concerns. Keep you regular scheduled OB appointment tomorrow.

## 2020-03-08 NOTE — Discharge Summary (Signed)
Jasmin Zimmerman Tedd Sias is a 19 y.o. female. She is at [redacted]w[redacted]d gestation. Patient's last menstrual period was 06/16/2019 (approximate). Estimated Date of Delivery: 03/12/20  Prenatal care site: [facility]   Chief complaint: vaginal bleeding this morning   Estephani reports waking up around 4am this morning and having some pink spotting with wiping.  Also reports irregular contractions that have become more frequent and more intense since this morning.    S: Resting comfortably.  No current VB.no LOF,  Active fetal movement.   Maternal Medical History:  Past Medical Hx:  has a past medical history of Anemia affecting pregnancy in third trimester (01/22/2020) and Biliary colic (08/27/2019).    Past Surgical Hx:  has a past surgical history that includes left shoulder surgery (Left) and Wisdom tooth extraction (Bilateral, 2018).   No Known Allergies   Prior to Admission medications   Medication Sig Start Date End Date Taking? Authorizing Provider  acetaminophen (TYLENOL) 325 MG tablet Take 325 mg by mouth every 6 (six) hours as needed for headache.   Yes [provider]  calcium carbonate (TUMS - DOSED IN MG ELEMENTAL CALCIUM) 500 MG chewable tablet Chew 1 tablet by mouth daily.   Yes [provider]  Prenatal Vit-Fe Fumarate-FA (MULTIVITAMIN-PRENATAL) 27-0.8 MG TABS tablet Take 1 tablet by mouth daily at 12 noon.   Yes [provider]  ferrous sulfate 324 (65 Fe) MG TBEC Take 1 tablet (325 mg total) by mouth in the morning, at noon, and at bedtime. 12/23/19 01/22/20  Federico Flake, MD    Social History: She  reports that she has never smoked. She has never used smokeless tobacco. She reports that she does not drink alcohol or use drugs.  Family History: family history includes Diabetes in her maternal grandfather, maternal grandmother, and maternal uncle; Kidney disease in her maternal grandfather; Vision loss in her maternal grandmother.   Review of Systems: A full review  of systems was performed and negative except as noted in the HPI.    O:  BP 119/62 (BP Location: Left Arm)   Pulse 83   Temp 98.1 F (36.7 C) (Oral)   Resp 18   LMP 06/16/2019 (Approximate) Comment: normal No results found for this or any previous visit (from the past 48 hour(s)).   Constitutional: NAD, AAOx3  HE/ENT: extraocular movements grossly intact, moist mucous membranes CV: RRR PULM: nl respiratory effort, CTABL     Abd: gravid, non-tender, non-distended, soft      Ext: Non-tender, Nonedmeatous   Psych: mood appropriate, speech normal Cervix: 4/60/-2/Mid/Soft, unchanged after 4 hours  Pelvic: small amount of dark red to brown discharge noted with exam   NST:  Baseline: 130 Variability: moderate Accelerations present x >2 Decelerations absent Toco: irregular mild contractions  Time   Assessment: 19 y.o. [redacted]w[redacted]d here for antenatal surveillance during pregnancy.  Principle diagnosis: Early labor, IUP at [redacted]w[redacted]d  Plan:  Labor: active labor not present, possible early labor   Minimal cervical change from 3-4 to 4cm over 2 hours.  Observed for additional 2 hours and no continued cervical change.    Fetal Wellbeing: Reassuring Cat 1 tracing.  Reactive NST   Reviewed comfort measures for early labor   Return for worsening contractions, LOF, vaginal bleeding, or decreased fetal movement.   Appointment for routine [facility] scheduled at [facility] in am   D/c home stable, precautions reviewed, follow-up as scheduled.   ----- Margaretmary Eddy, CNM Certified Nurse Midwife [facility] [place]  [facility]

## 2020-03-08 NOTE — H&P (Signed)
OB History & Physical   History of Present Illness:  Chief Complaint: contractions   HPI:  Jasmin Zimmerman is a 19 y.o. G1P0000 female at 38w3ddated by [id number] She presents to L&D for worsening contractions   Active FM onset of ctx @ 10:00 currently every 3-5 minutes Denies LOF  Reports bloody show    Pregnancy Issues: 1. Anemia - s/p iron transfusion  2. Obesity in pregnancy - BMI 40.62  Patient Active Problem List   Diagnosis Date Noted  . Uterine contractions during pregnancy 03/08/2020  . Anemia affecting pregnancy in third trimester 01/22/2020  . Encounter for antenatal screening for chromosomal anomalies   . Supervision of normal first pregnancy, antepartum 09/03/2019  . History of biliary colic 1227/78/2423  Maternal Medical History:   Past Medical History:  Diagnosis Date  . Anemia affecting pregnancy in third trimester 01/22/2020  . Biliary colic 1153/61/4431 @ ED-small gallstones seem    Past Surgical History:  Procedure Laterality Date  . left shoulder surgery Left    Left shoulder surgery in middle school, unsure date and reason  . WISDOM TOOTH EXTRACTION Bilateral 2018    No Known Allergies  Prior to Admission medications   Medication Sig Start Date End Date Taking? Authorizing Provider  acetaminophen (TYLENOL) 325 MG tablet Take 325 mg by mouth every 6 (six) hours as needed for headache.   Yes [provider]  calcium carbonate (TUMS - DOSED IN MG ELEMENTAL CALCIUM) 500 MG chewable tablet Chew 1 tablet by mouth daily.   Yes [provider]  Prenatal Vit-Fe Fumarate-FA (MULTIVITAMIN-PRENATAL) 27-0.8 MG TABS tablet Take 1 tablet by mouth daily at 12 noon.   Yes [provider]  ferrous sulfate 324 (65 Fe) MG TBEC Take 1 tablet (325 mg total) by mouth in the morning, at noon, and at bedtime. 12/23/19 01/22/20  NeCaren MacadamMD     [facility]   Social History: She  reports  that she has never smoked. She has never used smokeless tobacco. She reports that she does not drink alcohol or use drugs.  Family History: family history includes Diabetes in her maternal grandfather, maternal grandmother, and maternal uncle; Kidney disease in her maternal grandfather; Vision loss in her maternal grandmother.   Review of Systems: A full review of systems was performed and negative except as noted in the HPI.     Physical Exam:  Vital Signs: BP 125/64 (BP Location: Right Arm)   Pulse 76   Temp 98.8 F (37.1 C) (Oral)   Resp 16   Ht _0  (1.549 m)   Wt 97.5 kg   LMP 06/16/2019 (Approximate) Comment: normal  BMI 40.62 kg/m   General: no acute distress.  HEENT: normocephalic, atraumatic Heart: regular rate & rhythm.  No murmurs/rubs/gallops Lungs: clear to auscultation bilaterally, normal respiratory effort Abdomen: soft, gravid, non-tender;  EFW: 7lbs  Pelvic:   External: Normal external female genitalia  Cervix: Dilation: 6 / Effacement (%): 80 / Station: -2    Extremities: non-tender, symmetric, No edema bilaterally.  DTRs: 2+/2+  Neurologic: Alert & oriented x 3.    Results for orders placed or performed during the hospital encounter of 03/08/20 (from the past 24 hour(s))  CBC     Status: None   Collection Time: 03/08/20  6:55 PM  Result Value Ref Range   WBC 10.1 4.0 - 10.5 K/uL   RBC 4.64 3.87 -  5.11 MIL/uL   Hemoglobin 12.4 12.0 - 15.0 g/dL   HCT 38.0 36.0 - 46.0 %   MCV 81.9 80.0 - 100.0 fL   MCH 26.7 26.0 - 34.0 pg   MCHC 32.6 30.0 - 36.0 g/dL   RDW Not Measured 11.5 - 15.5 %   Platelets 307 150 - 400 K/uL   nRBC 0.0 0.0 - 0.2 %  SARS Coronavirus 2 by RT PCR (hospital order, performed in [place] hospital lab) Nasopharyngeal Nasopharyngeal Swab     Status: None   Collection Time: 03/08/20  7:09 PM   Specimen: Nasopharyngeal Swab  Result Value Ref Range   SARS Coronavirus 2 NEGATIVE NEGATIVE  Type and screen [place]      Status: None   Collection Time: 03/08/20  7:40 PM  Result Value Ref Range   ABO/RH(D) O POS    Antibody Screen NEG    Sample Expiration      03/11/2020,2359 Performed at [facility], [address] [place], [place] [address]     Pertinent Results:  Prenatal Labs: Blood type/Rh O pos   Antibody screen neg  Rubella Pending   Varicella Pending   RPR NR  HBsAg Neg  HIV NR  GC neg  Chlamydia neg  Genetic screening negative  1 hour GTT   3 hour GTT   GBS Negative    Flu vaccine given 09/09/2019 Tdap vaccine given 12/23/2019  FHT: 135bpm, moderate variability, accels present, decels absent  TOCO: 3-5, mild to moderate to palpation  SVE:  Dilation: 6 / Effacement (%): 80 / Station: -2    Cephalic by leopolds, SVE   Assessment:  Jasmin Zimmerman is a 19 y.o. G64P0000 female at 68w3dwith active labor.   Plan:  1. Admit to Labor & Delivery; consents reviewed and obtained - Preadmission covid screen per protocol   2. Fetal Well being  - Fetal Tracing: Cat 1 - Group B Streptococcus ppx indicated: N/A - GBS neg  - Presentation: cephalic confirmed by Leopolds and SVE    3. Routine OB: - Prenatal labs reviewed, as above - Rh pos - CBC, T&S, RPR on admit - Varicella and MMR immunizations complete - Varicella and Rubella immunity ordered  - Clear fluids, IVF, now saline lock  4. Monitoring of Labor -  External toco in place -  Pelvis adequate for trial of labor  -  Plan for continuous fetal monitoring ->will change to intermittent monitoring if continues to be reassuring and would like ambulate more  -  Maternal pain control as desired; requesting IVPM. Unsure about epidural, considering  - Anticipate vaginal delivery  5. Post Partum Planning: - Infant feeding: formula - Contraception: OCP's   AMinda Meo CNM 03/08/20 9:23 PM  ADrinda Butts CArcadiaCertified Nurse Midwife [facility]

## 2020-03-09 ENCOUNTER — Inpatient Hospital Stay: Payer: Medicaid Other | Admitting: Anesthesiology

## 2020-03-09 ENCOUNTER — Encounter: Admission: EM | Disposition: A | Discharge status: Home / Self Care | Payer: Self-pay | Source: Home / Self Care

## 2020-03-09 ENCOUNTER — Encounter: Payer: Self-pay | Admitting: Obstetrics and Gynecology

## 2020-03-09 ENCOUNTER — Inpatient Hospital Stay: Payer: Medicaid Other | Admitting: Registered Nurse

## 2020-03-09 ENCOUNTER — Ambulatory Visit: Payer: Medicaid Other

## 2020-03-09 DIAGNOSIS — Z3400 Encounter for supervision of normal first pregnancy, unspecified trimester: Secondary | ICD-10-CM | POA: Diagnosis not present

## 2020-03-09 HISTORY — PX: REPAIR VAGINAL CUFF: SHX6067

## 2020-03-09 LAB — CBC
HCT: 29.8 % — ABNORMAL LOW (ref 36.0–46.0)
Hemoglobin: 9.8 g/dL — ABNORMAL LOW (ref 12.0–15.0)
MCH: 27.1 pg (ref 26.0–34.0)
MCHC: 32.9 g/dL (ref 30.0–36.0)
MCV: 82.5 fL (ref 80.0–100.0)
Platelets: 242 K/uL (ref 150–400)
RBC: 3.61 MIL/uL — ABNORMAL LOW (ref 3.87–5.11)
WBC: 20.8 K/uL — ABNORMAL HIGH (ref 4.0–10.5)
nRBC: 0 % (ref 0.0–0.2)

## 2020-03-09 LAB — RPR: RPR Ser Ql: NONREACTIVE

## 2020-03-09 SURGERY — REPAIR, VAGINAL CUFF
Anesthesia: Epidural

## 2020-03-09 MED ORDER — LIDOCAINE HCL (PF) 2 % IJ SOLN
INTRAMUSCULAR | Status: AC
  Filled 2020-03-09: qty 5

## 2020-03-09 MED ORDER — FENTANYL 2.5 MCG/ML W/ROPIVACAINE 0.15% IN NS 100 ML EPIDURAL (ARMC)
12.0000 mL/h | EPIDURAL | Status: DC
  Filled 2020-03-09: qty 100

## 2020-03-09 MED ORDER — DIPHENHYDRAMINE HCL 50 MG/ML IJ SOLN: 12.5000 mg | INTRAMUSCULAR | Status: DC | PRN

## 2020-03-09 MED ORDER — COCONUT OIL OIL
1.0000 "application " | TOPICAL_OIL | Status: DC | PRN
  Administered 2020-03-10 – 2020-03-11 (×2): 1 via TOPICAL
  Filled 2020-03-09 (×2): qty 120

## 2020-03-09 MED ORDER — SOD CITRATE-CITRIC ACID 500-334 MG/5ML PO SOLN
ORAL | Status: AC
  Filled 2020-03-09: qty 30

## 2020-03-09 MED ORDER — MISOPROSTOL 200 MCG PO TABS: 800.0000 ug | ORAL_TABLET | Freq: Once | ORAL | Status: AC

## 2020-03-09 MED ORDER — ONDANSETRON HCL 4 MG PO TABS: 4.0000 mg | ORAL_TABLET | ORAL | Status: DC | PRN

## 2020-03-09 MED ORDER — BUPIVACAINE HCL (PF) 0.25 % IJ SOLN
INTRAMUSCULAR | Status: DC | PRN
  Administered 2020-03-09: 4 mL via EPIDURAL
  Administered 2020-03-09: 2 mL via EPIDURAL

## 2020-03-09 MED ORDER — LACTATED RINGERS IV SOLN
500.0000 mL | Freq: Once | INTRAVENOUS | Status: AC
  Administered 2020-03-09: 500 mL via INTRAVENOUS

## 2020-03-09 MED ORDER — LIDOCAINE HCL (PF) 2 % IJ SOLN
INTRAMUSCULAR | Status: DC | PRN
  Administered 2020-03-09 (×3): 100 mg via EPIDURAL

## 2020-03-09 MED ORDER — FENTANYL CITRATE (PF) 100 MCG/2ML IJ SOLN
100.0000 ug | INTRAMUSCULAR | Status: DC | PRN
  Administered 2020-03-09 (×3): 100 ug via INTRAVENOUS
  Filled 2020-03-09 (×3): qty 2

## 2020-03-09 MED ORDER — EPHEDRINE 5 MG/ML INJ: 10.0000 mg | INTRAVENOUS | Status: DC | PRN

## 2020-03-09 MED ORDER — LACTATED RINGERS IV SOLN: INTRAVENOUS | Status: DC | PRN

## 2020-03-09 MED ORDER — OXYTOCIN-SODIUM CHLORIDE 30-0.9 UT/500ML-% IV SOLN
1.0000 m[IU]/min | INTRAVENOUS | Status: DC
  Administered 2020-03-09: 2 m[IU]/min via INTRAVENOUS

## 2020-03-09 MED ORDER — ONDANSETRON HCL 4 MG/2ML IJ SOLN: 4.0000 mg | INTRAMUSCULAR | Status: DC | PRN

## 2020-03-09 MED ORDER — PHENYLEPHRINE 40 MCG/ML (10ML) SYRINGE FOR IV PUSH (FOR BLOOD PRESSURE SUPPORT): 80.0000 ug | PREFILLED_SYRINGE | INTRAVENOUS | Status: DC | PRN

## 2020-03-09 MED ORDER — PROPOFOL 10 MG/ML IV BOLUS
INTRAVENOUS | Status: AC
  Filled 2020-03-09: qty 20

## 2020-03-09 MED ORDER — LIDOCAINE HCL (PF) 2 % IJ SOLN
INTRAMUSCULAR | Status: AC
  Filled 2020-03-09: qty 10

## 2020-03-09 MED ORDER — CARBOPROST TROMETHAMINE 250 MCG/ML IM SOLN
INTRAMUSCULAR | Status: AC
  Filled 2020-03-09: qty 1

## 2020-03-09 MED ORDER — IBUPROFEN 600 MG PO TABS
600.0000 mg | ORAL_TABLET | Freq: Four times a day (QID) | ORAL | Status: DC
  Administered 2020-03-09 – 2020-03-11 (×7): 600 mg via ORAL
  Filled 2020-03-09 (×7): qty 1

## 2020-03-09 MED ORDER — TERBUTALINE SULFATE 1 MG/ML IJ SOLN: 0.2500 mg | Freq: Once | INTRAMUSCULAR | Status: DC | PRN

## 2020-03-09 MED ORDER — SENNOSIDES-DOCUSATE SODIUM 8.6-50 MG PO TABS
2.0000 | ORAL_TABLET | ORAL | Status: DC
  Administered 2020-03-10 – 2020-03-11 (×2): 2 via ORAL
  Filled 2020-03-09 (×2): qty 2

## 2020-03-09 MED ORDER — ACETAMINOPHEN 325 MG PO TABS
650.0000 mg | ORAL_TABLET | ORAL | Status: DC | PRN
  Administered 2020-03-09 – 2020-03-11 (×4): 650 mg via ORAL
  Filled 2020-03-09 (×4): qty 2

## 2020-03-09 MED ORDER — FENTANYL CITRATE (PF) 100 MCG/2ML IJ SOLN
INTRAMUSCULAR | Status: AC
  Filled 2020-03-09: qty 2

## 2020-03-09 MED ORDER — FERROUS SULFATE 325 (65 FE) MG PO TABS
325.0000 mg | ORAL_TABLET | Freq: Two times a day (BID) | ORAL | Status: DC
  Administered 2020-03-10 – 2020-03-11 (×3): 325 mg via ORAL
  Filled 2020-03-09 (×3): qty 1

## 2020-03-09 MED ORDER — FENTANYL CITRATE (PF) 100 MCG/2ML IJ SOLN
INTRAMUSCULAR | Status: DC | PRN
  Administered 2020-03-09: 100 ug via EPIDURAL

## 2020-03-09 MED ORDER — METHYLERGONOVINE MALEATE 0.2 MG/ML IJ SOLN: 0.2000 mg | Freq: Once | INTRAMUSCULAR | Status: AC

## 2020-03-09 MED ORDER — FENTANYL 2.5 MCG/ML W/ROPIVACAINE 0.15% IN NS 100 ML EPIDURAL (ARMC)
EPIDURAL | Status: DC | PRN
  Administered 2020-03-09: 12 mL/h via EPIDURAL

## 2020-03-09 MED ORDER — LIDOCAINE HCL (PF) 1 % IJ SOLN
INTRAMUSCULAR | Status: DC | PRN
  Administered 2020-03-09: 3 mL via SUBCUTANEOUS
  Administered 2020-03-09: 1.5 mL via SUBCUTANEOUS

## 2020-03-09 MED ORDER — BENZOCAINE-MENTHOL 20-0.5 % EX AERO
1.0000 "application " | INHALATION_SPRAY | CUTANEOUS | Status: DC | PRN
  Administered 2020-03-10 – 2020-03-11 (×2): 1 via TOPICAL
  Filled 2020-03-09 (×3): qty 56

## 2020-03-09 MED ORDER — LIDOCAINE-EPINEPHRINE (PF) 1.5 %-1:200000 IJ SOLN
INTRAMUSCULAR | Status: DC | PRN
  Administered 2020-03-09: 2 mL via EPIDURAL
  Administered 2020-03-09 (×2): 3 mL via EPIDURAL

## 2020-03-09 MED ORDER — FENTANYL 2.5 MCG/ML W/ROPIVACAINE 0.15% IN NS 100 ML EPIDURAL (ARMC)
EPIDURAL | Status: AC
  Filled 2020-03-09: qty 100

## 2020-03-09 MED ORDER — DIBUCAINE (PERIANAL) 1 % EX OINT: 1.0000 "application " | TOPICAL_OINTMENT | CUTANEOUS | Status: DC | PRN

## 2020-03-09 MED ORDER — SIMETHICONE 80 MG PO CHEW: 80.0000 mg | CHEWABLE_TABLET | ORAL | Status: DC | PRN

## 2020-03-09 MED ORDER — ZOLPIDEM TARTRATE 5 MG PO TABS: 5.0000 mg | ORAL_TABLET | Freq: Every evening | ORAL | Status: DC | PRN

## 2020-03-09 MED ORDER — PRENATAL MULTIVITAMIN CH
1.0000 | ORAL_TABLET | Freq: Every day | ORAL | Status: DC
  Administered 2020-03-10 – 2020-03-11 (×2): 1 via ORAL
  Filled 2020-03-09 (×2): qty 1

## 2020-03-09 MED ORDER — METHYLERGONOVINE MALEATE 0.2 MG/ML IJ SOLN
INTRAMUSCULAR | Status: AC
  Administered 2020-03-09: 0.2 mg via INTRAMUSCULAR
  Filled 2020-03-09: qty 1

## 2020-03-09 MED ORDER — WITCH HAZEL-GLYCERIN EX PADS: 1.0000 "application " | MEDICATED_PAD | CUTANEOUS | Status: DC | PRN

## 2020-03-09 MED ORDER — DIPHENHYDRAMINE HCL 25 MG PO CAPS: 25.0000 mg | ORAL_CAPSULE | Freq: Four times a day (QID) | ORAL | Status: DC | PRN

## 2020-03-09 SURGICAL SUPPLY — 35 items
BAG URINE DRAIN 2000ML AR STRL (UROLOGICAL SUPPLIES) ×2 IMPLANT
BLADE SURG SZ10 CARB STEEL (BLADE) ×2 IMPLANT
CANISTER SUCT 1200ML W/VALVE (MISCELLANEOUS) ×2 IMPLANT
CATH FOLEY 2WAY  5CC 16FR (CATHETERS) ×1
CATH URTH 16FR FL 2W BLN LF (CATHETERS) ×1 IMPLANT
DRAPE 3/4 80X56 (DRAPES) ×2 IMPLANT
DRAPE PERI LITHO V/GYN (MISCELLANEOUS) ×2 IMPLANT
DRAPE SURG 17X11 SM STRL (DRAPES) ×2 IMPLANT
DRAPE UNDER BUTTOCK W/FLU (DRAPES) ×2 IMPLANT
ELECT REM PT RETURN 9FT ADLT (ELECTROSURGICAL) ×2
ELECTRODE REM PT RTRN 9FT ADLT (ELECTROSURGICAL) ×1 IMPLANT
GAUZE 4X4 16PLY RFD (DISPOSABLE) ×6 IMPLANT
GLOVE BIOGEL PI IND STRL 6.5 (GLOVE) ×1 IMPLANT
GLOVE BIOGEL PI INDICATOR 6.5 (GLOVE) ×1
GLOVE SURG SYN 6.5 ES PF (GLOVE) ×4 IMPLANT
GOWN STRL REUS W/ TWL LRG LVL3 (GOWN DISPOSABLE) ×1 IMPLANT
GOWN STRL REUS W/TWL LRG LVL3 (GOWN DISPOSABLE) ×1
KIT TURNOVER CYSTO (KITS) ×2 IMPLANT
NDL SAFETY ECLIPSE 18X1.5 (NEEDLE) ×1 IMPLANT
NEEDLE HYPO 18GX1.5 SHARP (NEEDLE) ×1
NEEDLE HYPO 22GX1.5 SAFETY (NEEDLE) ×2 IMPLANT
NS IRRIG 500ML POUR BTL (IV SOLUTION) ×2 IMPLANT
PACK BASIN MINOR (MISCELLANEOUS) ×2 IMPLANT
PAD OB MATERNITY 4.3X12.25 (PERSONAL CARE ITEMS) ×2 IMPLANT
PAD PREP 24X41 OB/GYN DISP (PERSONAL CARE ITEMS) ×2 IMPLANT
SUT ETHIBOND NAB CT1 #1 30IN (SUTURE) ×2 IMPLANT
SUT VIC AB 0 CT1 27 (SUTURE)
SUT VIC AB 0 CT1 27XCR 8 STRN (SUTURE) IMPLANT
SUT VIC AB 0 CT1 36 (SUTURE) ×10 IMPLANT
SUT VIC AB 1 CT1 36 (SUTURE) IMPLANT
SUT VIC AB 2-0 CT1 (SUTURE) IMPLANT
SUT VIC AB 2-0 SH 27 (SUTURE) ×4
SUT VIC AB 2-0 SH 27XBRD (SUTURE) ×4 IMPLANT
SYR 10ML LL (SYRINGE) ×2 IMPLANT
SYR CONTROL 10ML LL (SYRINGE) ×2 IMPLANT

## 2020-03-09 NOTE — Anesthesia Preprocedure Evaluation (Signed)
Anesthesia Evaluation  Patient identified by MRN, date of birth, ID band Patient awake    Reviewed: Allergy & Precautions, NPO status , Patient's Chart, lab work & pertinent test results  History of Anesthesia Complications Negative for: history of anesthetic complications  Airway Mallampati: II  TM Distance: >3 FB Neck ROM: Full    Dental no notable dental hx.    Pulmonary neg pulmonary ROS, neg sleep apnea, neg COPD,    breath sounds clear to auscultation- rhonchi (-) wheezing      Cardiovascular Exercise Tolerance: Good (-) hypertension(-) CAD, (-) Past MI, (-) Cardiac Stents and (-) CABG  Rhythm:Regular Rate:Normal - Systolic murmurs and - Diastolic murmurs    Neuro/Psych neg Seizures negative neurological ROS  negative psych ROS   GI/Hepatic negative GI ROS, Neg liver ROS,   Endo/Other  negative endocrine ROSneg diabetes  Renal/GU negative Renal ROS     Musculoskeletal   Abdominal (+) + obese,   Peds  Hematology  (+) anemia ,   Anesthesia Other Findings Past Medical History: 01/22/2020: Anemia affecting pregnancy in third trimester 08/27/2019: Biliary colic     Comment:  @ ED-small gallstones seem   Reproductive/Obstetrics S/p vaginal delivery with epidural analgesia, now with vaginal laceration requiring OR for better visualization                              Anesthesia Physical Anesthesia Plan  ASA: II  Anesthesia Plan: Epidural   Post-op Pain Management:    Induction:   PONV Risk Score and Plan: 2 and Ondansetron  Airway Management Planned:   Additional Equipment:   Intra-op Plan:   Post-operative Plan:   Informed Consent: I have reviewed the patients History and Physical, chart, labs and discussed the procedure including the risks, benefits and alternatives for the proposed anesthesia with the patient or authorized representative who has indicated his/her  understanding and acceptance.       Plan Discussed with: CRNA and Anesthesiologist  Anesthesia Plan Comments:         Anesthesia Quick Evaluation

## 2020-03-09 NOTE — Progress Notes (Signed)
Patient ID: Jasmin Zimmerman, female   DOB: 22-Mar-2001, 19 y.o.   MRN: [id number] Called to see pt with continued bleeding  After a svd . &00 cc loss . I tried to control bleeding of right sulcus tear  And continue bleeding on left .  Still with persistent bleeding . I recommend going to the OR to repair .Vagin packed with 2x 8 guaze x 2 . Total EBL 1100 cc. Consent signed .

## 2020-03-09 NOTE — Transfer of Care (Signed)
Immediate Anesthesia Transfer of Care Note  Patient: Jasmin Zimmerman  Procedure(s) Performed: REPAIR VAGINAL CUFF (N/A )  Patient Location: PACU  Anesthesia Type:Epidural  Level of Consciousness: awake, alert  and oriented  Airway & Oxygen Therapy: Patient Spontanous Breathing  Post-op Assessment: Report given to RN and Post -op Vital signs reviewed and stable  Post vital signs: Reviewed and stable  Last Vitals:  Vitals Value Taken Time  BP 103/53 03/09/20 1607  Temp 37.5 C 03/09/20 1607  Pulse 88 03/09/20 1609  Resp 23 03/09/20 1609  SpO2 100 % 03/09/20 1609  Vitals shown include unvalidated device data.  Last Pain:  Vitals:   03/09/20 1607  TempSrc: Temporal  PainSc: 0-No pain      Patients Stated Pain Goal: 0 (03/09/20 0730)  Complications: No apparent anesthesia complications

## 2020-03-09 NOTE — Progress Notes (Signed)
Labor Progress Note  Jasmin Zimmerman is a 19 y.o. G1P0000 at [redacted]w[redacted]d by ultrasound admitted for active labor  Subjective: breathing well with contractions   Objective: BP 125/64 (BP Location: Right Arm)   Pulse 76   Temp 98.8 F (37.1 C) (Oral)   Resp 16   Ht 5\' 1"  (1.549 m)   Wt 97.5 kg   LMP 06/16/2019 (Approximate) Comment: normal  BMI 40.62 kg/m  Notable VS details: reviewed   Fetal Assessment: FHT:  FHR: 135 bpm, variability: moderate,  accelerations:  Present,  decelerations:  Absent Category/reactivity:  Category I UC:   regular, every 2-5 minutes, moderate to palpation SVE: 8/90/-1 per RN exam   Membrane status: Bulging Amniotic color: N/A  Labs: Lab Results  Component Value Date   WBC 10.1 03/08/2020   HGB 12.4 03/08/2020   HCT 38.0 03/08/2020   MCV 81.9 03/08/2020   PLT 307 03/08/2020    Assessment / Plan: Spontaneous labor, progressing normally  Labor: Progressing normally.  Discussed AROM versus continued low intervention labor.  Latanza is coping well with contractions and has continued to spontaneously progress through her labor.  Will consider AROM for intervention for augmentation if needed.   Preeclampsia:  No s/s Fetal Wellbeing:  Category I Pain Control:  IV pain meds I/D:  n/a  Darien Ramus, CNM 03/09/2020, 1:00 AM

## 2020-03-09 NOTE — Anesthesia Preprocedure Evaluation (Signed)
Anesthesia Evaluation  Patient identified by MRN, date of birth, ID band Patient awake    Reviewed: Allergy & Precautions, NPO status , Patient's Chart, lab work & pertinent test results  History of Anesthesia Complications Negative for: history of anesthetic complications  Airway Mallampati: III       Dental   Pulmonary neg sleep apnea, neg COPD, Not current smoker,           Cardiovascular (-) hypertension(-) Past MI and (-) CHF (-) dysrhythmias (-) Valvular Problems/Murmurs     Neuro/Psych neg Seizures    GI/Hepatic Neg liver ROS, GERD (gestational)  ,  Endo/Other  neg diabetesMorbid obesity  Renal/GU negative Renal ROS     Musculoskeletal   Abdominal (+) + obese,   Peds  Hematology   Anesthesia Other Findings   Reproductive/Obstetrics (+) Pregnancy                             Anesthesia Physical Anesthesia Plan  ASA: III  Anesthesia Plan: Epidural   Post-op Pain Management:    Induction:   PONV Risk Score and Plan:   Airway Management Planned:   Additional Equipment:   Intra-op Plan:   Post-operative Plan:   Informed Consent: I have reviewed the patients History and Physical, chart, labs and discussed the procedure including the risks, benefits and alternatives for the proposed anesthesia with the patient or authorized representative who has indicated his/her understanding and acceptance.       Plan Discussed with:   Anesthesia Plan Comments:         Anesthesia Quick Evaluation

## 2020-03-09 NOTE — Anesthesia Procedure Notes (Signed)
Epidural Patient location during procedure: OB Start time: 03/09/2020 6:41 AM End time: 03/09/2020 7:03 AM  Staffing Performed: anesthesiologist   Preanesthetic Checklist Completed: patient identified, IV checked, site marked, risks and benefits discussed, surgical consent, monitors and equipment checked, pre-op evaluation and timeout performed  Epidural Patient position: sitting Prep: Betadine Patient monitoring: heart rate, continuous pulse ox and blood pressure Approach: midline Location: L4-L5 Injection technique: LOR saline  Needle:  Needle type: Tuohy  Needle gauge: 17 G Needle length: 9 cm and 9 Needle insertion depth: 9 cm Catheter type: closed end flexible Catheter size: 20 Guage Catheter at skin depth: 15 cm Test dose: negative and 1.5% lidocaine with Epi 1:200 K  Assessment Events: blood not aspirated, injection not painful, no injection resistance, no paresthesia and negative IV test  Additional Notes   Patient tolerated the insertion well without complications.Reason for block:procedure for pain

## 2020-03-09 NOTE — Brief Op Note (Signed)
03/08/2020 - 03/09/2020  3:54 PM  PATIENT:  Jasmin Zimmerman  18 y.o. female  PRE-OPERATIVE DIAGNOSIS:  Vaginal laceration s/p vaginal bleeding uncontrolled  POST-OPERATIVE DIAGNOSIS:  Bilateral sulcus and labia lacerations  PROCEDURE:  Extensive repair if vaginal lacerations  SURGEON:  Surgeon(s) and Role:    * Sequoya Hogsett, Ihor Austin, MD - Primary  PHYSICIAN ASSISTANT: Heloise Ochoa   ASSISTANTS: none   ANESTHESIA:  CLE  EBL:  150 mL , IOF 500 cc  BLOOD ADMINISTERED:none  DRAINS: Urinary Catheter (Foley)   LOCAL MEDICATIONS USED:  NONE  SPECIMEN:  No Specimen  DISPOSITION OF SPECIMEN:  N/A  COUNTS:  YES  TOURNIQUET:  * No tourniquets in log *  DICTATION: .Other Dictation: Dictation Number verbal   PLAN OF CARE: Admit to inpatient   PATIENT DISPOSITION:  PACU - hemodynamically stable.   Delay start of Pharmacological VTE agent (>24hrs) due to surgical blood loss or risk of bleeding: not applicable

## 2020-03-09 NOTE — Progress Notes (Signed)
Labor Progress Note  Jasmin Zimmerman is a 19 y.o. G1P0000 at [redacted]w[redacted]d by ultrasound admitted for active labor  Subjective: now comfortable with epidural and ready for a nap.   Objective: BP (!) 101/54   Pulse 72   Temp 98.4 F (36.9 C) (Oral)   Resp 18   Ht 5\' 1"  (1.549 m)   Wt 97.5 kg   LMP 06/16/2019 (Approximate) Comment: normal  SpO2 99%   BMI 40.62 kg/m  Notable VS details: reviewed.   Fetal Assessment: FHT:  FHR: 130 bpm, variability: moderate,  accelerations:  Abscent,  decelerations:  Absent Category/reactivity:  Category I UC:   regular, every 1.5-3 minutes SVE:   8-9/90/1, FSE and IUPC placed.  Membrane status: AROM at 0438 Amniotic color: meconium  Labs: Lab Results  Component Value Date   WBC 10.1 03/08/2020   HGB 12.4 03/08/2020   HCT 38.0 03/08/2020   MCV 81.9 03/08/2020   PLT 307 03/08/2020    Assessment / Plan: Augmentation of labor, progressing well  Labor: progressing after pitocin and epidural for pt comfort. FSE and IUPC placed.  Preeclampsia:  no e/o Pre-e Fetal Wellbeing:  Category I Pain Control:  Epidural I/D:  n/a Anticipated MOD:  NSVD  05/08/2020 Nadiyah Zeis, CNM 03/09/2020, 9:10 AM

## 2020-03-09 NOTE — Discharge Summary (Signed)
Obstetrical Discharge Summary  Patient Name: Jasmin Zimmerman DOB: 2001/06/07 MRN: [id number]  Date of Admission: 03/08/2020 Date of Delivery: 03/09/20 Delivered by: Heloise Ochoa CNM Date of Discharge: 03/11/2020  Primary OB:  [facility]  XTG:GYIRSWN'I last menstrual period was 06/16/2019 (approximate). EDC Estimated Date of Delivery: 03/12/20 Gestational Age at Delivery: [redacted]w[redacted]d   Antepartum complications:  1. Anemia - s/p iron transfusion  2. Obesity in pregnancy - BMI 40.62  Admitting Diagnosis: active labor Secondary Diagnosis: postpartum hemorrhage, vaginal lacerations following delivery, SVD, meconium stained fluid  Patient Active Problem List   Diagnosis Date Noted  . PPH (postpartum hemorrhage) 03/09/2020  . NSVD (normal spontaneous vaginal delivery) 03/09/2020  . Anemia affecting pregnancy in third trimester 01/22/2020  . Encounter for antenatal screening for chromosomal anomalies   . Supervision of normal first pregnancy, antepartum 09/03/2019  . History of biliary colic 09/03/2019    Augmentation: AROM and Pitocin Complications: Hemorrhage>1065mL Intrapartum complications/course: prolonged active phase, s/p augmentation, progressed to C/C/+2 and pushed effectively for SVD. Vaginal  Lacerations, sidewall and sulcus; see delivery and Op notes for repair.  Date of Delivery: 03/09/20 Delivered By: Heloise Ochoa CNM Delivery Type: spontaneous vaginal delivery Anesthesia: epidural Placenta: spontaneous Laceration: extensive vaginal lacerations, periurethral, sidewall, sulcus and perineal. Episiotomy: none Newborn Data: Live born female "Matteo" Birth Weight: 7 lb 3.3 oz (3270 g) APGAR: 8, 9  Newborn Delivery   Birth date/time: 03/09/2020 12:58:00 Delivery type: Vaginal, Spontaneous        Postpartum Procedures: vaginal laceration repair in OR with Dr Feliberto Gottron  Jasmin Zimmerman:  Jasmin Zimmerman Postnatal Depression Scale Screening Tool 03/10/2020 03/09/2020 03/09/2020  I have been able  to laugh and see the funny side of things. 0 (No Data) (No Data)  I have looked forward with enjoyment to things. 0 - -  I have blamed myself unnecessarily when things went wrong. 0 - -  I have been anxious or worried for no good reason. 0 - -  I have felt scared or panicky for no good reason. 0 - -  Things have been getting on top of me. 0 - -  I have been so unhappy that I have had difficulty sleeping. 0 - -  I have felt sad or miserable. 0 - -  I have been so unhappy that I have been crying. 0 - -  The thought of harming myself has occurred to me. 0 - -  Edinburgh Postnatal Depression Scale Total 0 - -      Post partum course:  Patient had an uncomplicated postpartum course.  By time of discharge on PPD#2, her pain was controlled on oral pain medications; she had appropriate lochia and was ambulating, voiding without difficulty and tolerating regular diet.  She was deemed stable for discharge to home.     Discharge Physical Exam:  BP 121/62 (BP Location: Left Arm)   Pulse 92   Temp 98 F (36.7 C)   Resp 20   Ht 5\' 1"  (1.549 m)   Wt 97.5 kg   LMP 06/16/2019 (Approximate) Comment: normal  SpO2 100%   Breastfeeding Unknown   BMI 40.62 kg/m   General: NAD CV: RRR Pulm: CTABL, nl effort ABD: s/nd/nt, fundus firm and below the umbilicus Lochia: moderate Perineum: well approximated, minimal edema DVT Evaluation: LE non-ttp, no evidence of DVT on exam.  Hemoglobin  Date Value Ref Range Status  03/10/2020 9.0 (L) 12.0 - 15.0 g/dL Final  05/10/2020 8.8 (L) 11.1 - 15.9 g/dL Final   HCT  Date Value Ref Range Status  03/10/2020 27.4 (L) 36 - 46 % Final   Hematocrit  Date Value Ref Range Status  12/23/2019 27.8 (L) 34.0 - 46.6 % Final     Disposition: stable, discharge to home. Baby Feeding: breastmilk and formula Baby Disposition: home with mom  Rh Immune globulin given: n/a Rubella vaccine given: immune Varicella vaccine given: immune Tdap vaccine given in AP or  PP setting: 12/23/19 Flu vaccine given in AP or PP setting: 09/09/2019  Contraception: pills- to start at 6wks PP  Prenatal Labs:  Blood type/Rh O pos   Antibody screen neg  Rubella Pending   Varicella Pending   RPR NR  HBsAg Neg  HIV NR  GC neg  Chlamydia neg  Genetic screening negative  1 hour GTT   GBS Negative       Plan:  Jasmin Zimmerman was discharged to home in good condition. Follow-up appointment with delivering provider in 2 weeks.  Discharge Medications: Allergies as of 03/11/2020   No Known Allergies     Medication List    TAKE these medications   acetaminophen 325 MG tablet Commonly known as: Tylenol Take 2 tablets (650 mg total) by mouth every 4 (four) hours as needed (for pain scale < 4). What changed:   how much to take  when to take this  reasons to take this   benzocaine-Menthol 20-0.5 % Aero Commonly known as: DERMOPLAST Apply 1 application topically as needed for irritation (perineal discomfort).   calcium carbonate 500 MG chewable tablet Commonly known as: TUMS - dosed in mg elemental calcium Chew 1 tablet by mouth daily.   ferrous sulfate 324 (65 Fe) MG Tbec Take 1 tablet (325 mg total) by mouth in the morning, at noon, and at bedtime.   ibuprofen 600 MG tablet Commonly known as: ADVIL Take 1 tablet (600 mg total) by mouth every 6 (six) hours.   multivitamin-prenatal 27-0.8 MG Tabs tablet Take 1 tablet by mouth daily at 12 noon.   senna-docusate 8.6-50 MG tablet Commonly known as: Senokot-S Take 2 tablets by mouth daily. Start taking on: March 12, 2020   witch hazel-glycerin pad Commonly known as: TUCKS Apply 1 application topically 4 (four) times daily.        Follow-up Information    Alieah Brinton, Murray Hodgkins, CNM. Go on 03/25/2020.   Specialty: Obstetrics and Gynecology Why: Thursday, June 24th at 10:45am for postpartum laceration check Contact information: [place]  [id number] [phone]                Signed:  Francetta Found, CNM 03/11/2020  9:43 AM

## 2020-03-09 NOTE — Anesthesia Procedure Notes (Addendum)
Epidural Patient location during procedure: OB Start time: 03/09/2020 8:03 AM End time: 03/09/2020 8:15 AM  Staffing Anesthesiologist: Alver Fisher, MD Resident/CRNA: Elmarie Mainland, CRNA Performed: resident/CRNA   Preanesthetic Checklist Completed: patient identified, IV checked, site marked, risks and benefits discussed, surgical consent, monitors and equipment checked, pre-op evaluation and timeout performed  Epidural Patient position: sitting Prep: ChloraPrep Patient monitoring: heart rate, continuous pulse ox and blood pressure Approach: midline Location: L3-L4 Injection technique: LOR saline  Needle:  Needle type: Tuohy  Needle gauge: 17 G Needle length: 9 cm and 9 Needle insertion depth: 8 cm Catheter type: closed end flexible Catheter size: 19 Gauge Catheter at skin depth: 12 cm Test dose: negative and 1.5% lidocaine with Epi 1:200 K  Assessment Sensory level: T10 Events: blood not aspirated, injection not painful, no injection resistance, no paresthesia and negative IV test  Additional Notes 1 attempt Pt. Evaluated and documentation done after procedure finished. Patient identified. Risks/Benefits/Options discussed with patient including but not limited to bleeding, infection, nerve damage, paralysis, failed block, incomplete pain control, headache, blood pressure changes, nausea, vomiting, reactions to medication both or allergic, itching and postpartum back pain. Confirmed with bedside nurse the patient's most recent platelet count. Confirmed with patient that they are not currently taking any anticoagulation, have any bleeding history or any family history of bleeding disorders. Patient expressed understanding and wished to proceed. All questions were answered. Sterile technique was used throughout the entire procedure. Please see nursing notes for vital signs. Test dose was given through epidural catheter and negative prior to continuing to dose epidural or start  infusion. Warning signs of high block given to the patient including shortness of breath, tingling/numbness in hands, complete motor block, or any concerning symptoms with instructions to call for help. Patient was given instructions on fall risk and not to get out of bed. All questions and concerns addressed with instructions to call with any issues or inadequate analgesia.   Patient tolerated the insertion well without immediate complications.Reason for block:procedure for pain

## 2020-03-09 NOTE — Lactation Note (Signed)
This note was copied from a baby's chart. Lactation Consultation Note  Patient Name: Jasmin Zimmerman [id number] Date: 03/09/2020 Reason for consult: Initial assessment;1st time breastfeeding;Primapara;Term  LC in to assist with first feeding at the breast. Mom is G1P1, and had post delivery bleeding resulting in trip to OR for repair; first feeding was delayed. Baby brought to the breast, full LC assist for feeding. Mom's nipples appeared flat, but everted with some stimulation, and mom states they are normally everted- fluid given may be impacting appearance. LC held breast tissue throughout entire feeding. Baby on and off the breast, LC hand expressing colostrum easily to entice re-latch.  Mom questions the need for pumping; at this time advised to put baby to breast on cue, and skin to skin as often as possible. Mom's feeding choice was both, but was encouraged to offer breast before supplement, as baby is the best regulator of milk supply. Reviewed newborn stomach size, feeding patterns in the first 24 hours, early hunger cues, and output expectations. Encouraged to call for assistance with each feeding as needed.  Maternal Data Formula Feeding for Exclusion: No Has patient been taught Hand Expression?: Yes Does the patient have breastfeeding experience prior to this delivery?: No(first baby)  Feeding Feeding Type: Breast Fed  LATCH Score Latch: Repeated attempts needed to sustain latch, nipple held in mouth throughout feeding, stimulation needed to elicit sucking reflex.  Audible Swallowing: A few with stimulation  Type of Nipple: Flat(mom says it normally is everted)  Comfort (Breast/Nipple): Soft / non-tender  Hold (Positioning): Full assist, staff holds infant at breast  LATCH Score: 5  Interventions Interventions: Breast feeding basics reviewed;Assisted with latch;Breast massage;Hand express;Breast compression;Adjust position;Support pillows  Lactation Tools  Discussed/Used     Consult Status Consult Status: Follow-up Date: 03/10/20 Follow-up type: In-patient    [name] 03/09/2020, 5:24 PM

## 2020-03-09 NOTE — Progress Notes (Signed)
Labor Progress Note  Jasmin Zimmerman is a 19 y.o. G1P0000 at [redacted]w[redacted]d by ultrasound admitted for active labor  Subjective: Uncomfortable with contractions, requires coaching to help breath through each contraction. Mother at bedside, supportive.   Objective: BP (!) 118/53 (BP Location: Right Arm)   Pulse 66   Temp 98.1 F (36.7 C) (Oral)   Resp 18   Ht 5\' 1"  (1.549 m)   Wt 97.5 kg   LMP 06/16/2019 (Approximate) Comment: normal  SpO2 99%   BMI 40.62 kg/m  Notable VS details: Reviewed   Fetal Assessment: FHT:  FHR: 125 bpm, variability: moderate,  accelerations:  Present,  decelerations:  Absent Category/reactivity:  Category I UC:   regular, every 2-3 minutes, moderate to palpation  SVE:  7/90/0  Membrane status: AROM at 0438 Amniotic color: Mec  Labs: Lab Results  Component Value Date   WBC 10.1 03/08/2020   HGB 12.4 03/08/2020   HCT 38.0 03/08/2020   MCV 81.9 03/08/2020   PLT 307 03/08/2020    Assessment / Plan: Protracted active phase  Labor: Fetal head has descended more into the pelvis and she is reporting more pressure.  Disucssed augmenation with oxytocin and monitoring of contractions with IUPC.  She is working to cope with each contractions and does not want to start oxytocin yet.  Asking for epidural.  Will assess after epidural and start oxytocin and place IUPC if unchanged.  Preeclampsia:  No s/s Fetal Wellbeing:  Category I Pain Control:  s/p IVPM.  Requesting epidural.  Anesthesia notified.  I/D:  AROM at 0438, afebrile  05/08/2020, CNM 03/09/2020, 6:23 AM

## 2020-03-09 NOTE — Progress Notes (Signed)
Labor Progress Note  Jasmin Zimmerman is a 19 y.o. G1P0000 at [redacted]w[redacted]d by ultrasound admitted for active labor  Subjective: more uncomfortable with contractions, still breathing well with contractions but starting to become more tearful.  S/P IVPM with fentanyl.    Objective: BP (!) 107/52 (BP Location: Right Arm)   Pulse 67   Temp 98.3 F (36.8 C) (Oral)   Resp 18   Ht 5\' 1"  (1.549 m)   Wt 97.5 kg   LMP 06/16/2019 (Approximate) Comment: normal  SpO2 99%   BMI 40.62 kg/m  Notable VS details: Reviewed   Fetal Assessment: FHT:  FHR: 125 bpm, variability: moderate,  accelerations:  Present,  decelerations:  Absent Category/reactivity:  Category I UC:   regular, every 2-4 minutes, moderate to palpation  SVE:   8-9/90/-1 -> decreased to 7/90/-1 after AROM Membrane status: AROM at 0438 Amniotic color: Meconium   Labs: Lab Results  Component Value Date   WBC 10.1 03/08/2020   HGB 12.4 03/08/2020   HCT 38.0 03/08/2020   MCV 81.9 03/08/2020   PLT 307 03/08/2020    Assessment / Plan: Protracted active phase  Labor: Previously spontaneous progression to 8-9 but minimal change over 4 hours.  Discussed risks/benefits of augmentation with AROM and/or oxytocin.  Reviewed that there has been minimal change and recommend an intervention to augment labor.  Temari declines oxytocin at this time, amenable to AROM.  Will consider augmentation with oxytocin if no to minimal cervical change over the next 2 hours.  Preeclampsia:  No s/s Fetal Wellbeing:  Category I Pain Control:  IV pain meds I/D:  afebrile, AROM at 0438   05/08/2020, CNM 03/09/2020, 4:47 AM

## 2020-03-09 NOTE — Progress Notes (Signed)
RN at bedside during repair providing intermittent fundal checks as directed by CNM and MD and intermittent epidural function checks. Pt's fundus remains firm and CNM/MD at bedside assessing bleeding. VSS stable.

## 2020-03-09 NOTE — Progress Notes (Signed)
To SDS Briefly, verified consent and NPO status, and IV patency. Anesthesia in, OR nurse in, Pt to OR

## 2020-03-09 NOTE — Progress Notes (Signed)
ANMD called to evaluate pt- experiencing no relief from epidural.  CRNA at bedside.  Pt. Sitting on side of bed for epidural replacement; tracing maternal HR.  RN at bedside.

## 2020-03-09 NOTE — Anesthesia Procedure Notes (Signed)
Date/Time: 03/09/2020 3:03 PM Performed by: Stormy Fabian, CRNA Pre-anesthesia Checklist: Patient identified, Emergency Drugs available, Suction available and Patient being monitored Patient Re-evaluated:Patient Re-evaluated prior to induction Oxygen Delivery Method: Nasal cannula Induction Type: IV induction Dental Injury: Teeth and Oropharynx as per pre-operative assessment  Comments: Nasal cannula with etCO2 monitoring

## 2020-03-09 NOTE — Op Note (Signed)
NAME: Jasmin Zimmerman, GUERRIERI MEDICAL RECORD [id number] ACCOUNT [id number] DATE OF BIRTH:05-17-2001 FACILITY: [facility] LOCATION: [facility] PHYSICIAN:Kimarie Coor Cloyde Reams, MD  OPERATIVE REPORT  DATE OF PROCEDURE:  03/09/2020  PREOPERATIVE DIAGNOSES:  Extensive vaginal laceration, status post spontaneous vaginal delivery with uncontrolled bleeding.  POSTOPERATIVE DIAGNOSES:   1.  Bilateral vaginal sulcus tears.   2.  Bilateral labial tears.   3.  Extensive lacerations.   4.  Total laceration lengths equal 10 cm.  PROCEDURE:  Extensive repair of vaginal lacerations.  SURGEON:  Jennell Corner, MD  FIRST ASSISTANT:  Heloise Ochoa, certified nurse midwife.  ANESTHESIA:  Continuous lumbar epidural.  INDICATIONS:  A 19 year old gravida 1, now para 1, status post spontaneous vaginal delivery by nurse midwife McVey, who was unable to control bleeding after delivery.  Nurse midwife attempted to repair lacerations, but bleeding continued.  I came to the  delivery suite and after multiple attempts to place suture to control bleeding, bleeding continued.  DESCRIPTION OF PROCEDURE:  After adequate continuous lumbar epidural anesthesia, the patient's legs were placed in the candy cane stirrups.  The patient's perineum and vagina were prepped and draped in normal sterile fashion.  Timeout was performed.   Straight catheterization of the bladder yielded 150 mL urine.  Sidewall retractor, Deaver retractor placed, showed evidence of bilateral labial lacerations just lateral to the urethral meatus.  Several 2-0 Vicryl running sutures were used for  reapproximation of the tissue edges.  Total repair length 10 cm with approximately 5 additional cm repaired prior to taking the patient to the recovery room.  Good hemostasis was noted.  Foley catheter was replaced and will stay in place overnight.   There were no complications.  The patient tolerated the procedure well.  ESTIMATED BLOOD LOSS:  150  mL.  INTRAOPERATIVE FLUIDS:  500 mL.  URINE OUTPUT:  150 mL.  DISPOSITION:  The patient was taken to recovery room in good condition.  VN/NUANCE  D:03/09/2020 T:03/09/2020 JOB:[id number]

## 2020-03-09 NOTE — Progress Notes (Signed)
Labor Progress Note  Jasmin Zimmerman is a 19 y.o. G1P0000 at [redacted]w[redacted]d by ultrasound admitted for active labor  Subjective: feeling more pelvic and low abdominal discomfort.   Objective: BP (!) 101/54    Pulse 72    Temp 98.4 F (36.9 C) (Oral)    Resp 18    Ht 5\' 1"  (1.549 m)    Wt 97.5 kg    LMP 06/16/2019 (Approximate) Comment: normal   SpO2 99%    BMI 40.62 kg/m  Notable VS details: reviewed.   Fetal Assessment: FHT:  130bpm, mod variability, + accels, + early and variable decels.  Category/reactivity:  Category II UC:   regular, every 1.5-3 minutes, pitocin at 11mu/min, MVUs 210 SVE:  C/C/+2 with molding noted, OP presentation.  Membrane status: AROM at 0438 Amniotic color: meconium  Labs: Lab Results  Component Value Date   WBC 10.1 03/08/2020   HGB 12.4 03/08/2020   HCT 38.0 03/08/2020   MCV 81.9 03/08/2020   PLT 307 03/08/2020    Assessment / Plan: G1 at 39.4wks, protracted active phase now 2nd stage with OP presentation.   Labor: will begin pushing now, position changes due to FHR decels and attempt to rotate fetal position . Dr 05/08/2020 updated.  Preeclampsia:  no e/o Pre-e Fetal Wellbeing:  Category II Pain Control:  Epidural I/D:  n/a Anticipated MOD:  NSVD  Feliberto Gottron, CNM 03/09/2020, 12:04 PM

## 2020-03-10 LAB — CBC WITH DIFFERENTIAL/PLATELET
Abs Immature Granulocytes: 0.11 10*3/uL — ABNORMAL HIGH (ref 0.00–0.07)
Basophils Absolute: 0 10*3/uL (ref 0.0–0.1)
Basophils Relative: 0 %
Eosinophils Absolute: 0 10*3/uL (ref 0.0–0.5)
Eosinophils Relative: 0 %
HCT: 27.4 % — ABNORMAL LOW (ref 36.0–46.0)
Hemoglobin: 9 g/dL — ABNORMAL LOW (ref 12.0–15.0)
Immature Granulocytes: 1 %
Lymphocytes Relative: 9 %
Lymphs Abs: 1.5 10*3/uL (ref 0.7–4.0)
MCH: 27.7 pg (ref 26.0–34.0)
MCHC: 32.8 g/dL (ref 30.0–36.0)
MCV: 84.3 fL (ref 80.0–100.0)
Monocytes Absolute: 1.2 10*3/uL — ABNORMAL HIGH (ref 0.1–1.0)
Monocytes Relative: 8 %
Neutro Abs: 13.3 10*3/uL — ABNORMAL HIGH (ref 1.7–7.7)
Neutrophils Relative %: 82 %
Platelets: 224 10*3/uL (ref 150–400)
RBC: 3.25 MIL/uL — ABNORMAL LOW (ref 3.87–5.11)
Smear Review: NORMAL
WBC: 16.2 10*3/uL — ABNORMAL HIGH (ref 4.0–10.5)
nRBC: 0 % (ref 0.0–0.2)

## 2020-03-10 MED ORDER — DIBUCAINE (PERIANAL) 1 % EX OINT
1.0000 "application " | TOPICAL_OINTMENT | CUTANEOUS | Status: DC | PRN
  Administered 2020-03-10 – 2020-03-11 (×2): 1 via RECTAL
  Filled 2020-03-10 (×3): qty 28

## 2020-03-10 MED ORDER — WITCH HAZEL-GLYCERIN EX PADS
1.0000 "application " | MEDICATED_PAD | Freq: Four times a day (QID) | CUTANEOUS | Status: DC
  Administered 2020-03-10 – 2020-03-11 (×4): 1 via TOPICAL
  Filled 2020-03-10 (×2): qty 100

## 2020-03-10 NOTE — Progress Notes (Signed)
Post Partum Day 1  Subjective: Reports pain is manageable but mostly vaginal.    Doing well, no concerns. Ambulating without difficulty, pain managed with PO meds, tolerating regular diet, and foley in place d/t extensive swelling.   No fever/chills, chest pain, shortness of breath, nausea/vomiting, or leg pain. No nipple or breast pain.   Objective: BP (!) 95/55 (BP Location: Right Arm) Comment: nurse notified  Pulse 85   Temp 98.4 F (36.9 C) (Oral)   Resp 18   Ht _0  (1.549 m)   Wt 97.5 kg   LMP 06/16/2019 (Approximate) Comment: normal  SpO2 98%   Breastfeeding Unknown   BMI 40.62 kg/m    Physical Exam:  General: alert and cooperative Breasts: soft/nontender, using shields  CV: RRR Pulm: nl effort Abdomen: soft, non-tender Uterine Fundus: firm Incision: n/a Perineum: Swollen, repair well approximated Lochia: appropriate DVT Evaluation: No evidence of DVT seen on physical exam. Edinburgh:  Edinburgh Postnatal Depression Scale Screening Tool 03/09/2020 03/09/2020  I have been able to laugh and see the funny side of things. (No Data) (No Data)     Recent Labs    03/09/20 2002 03/10/20 0433  HGB 9.8* 9.0*  HCT 29.8* 27.4*  WBC 20.8* 16.2*  PLT 242 224    Assessment/Plan: 19 y.o. G1P1001 postpartum day # 1  -Continue routine postpartum care -Lactation consult PRN for breastfeeding   -Acute blood loss anemia - hemodynamically stable and asymptomatic; start PO ferrous sulfate BID with stool softeners  -Immunization status: Needs varicella, MMR prior to discharge  Disposition: Continue inpatient postpartum care    LOS: 2 days   Dontrail Blackwell, CNM 03/10/2020, 8:27 AM

## 2020-03-10 NOTE — Anesthesia Postprocedure Evaluation (Signed)
Anesthesia Post Note  Patient: Jasmin Zimmerman  Procedure(s) Performed: REPAIR VAGINAL CUFF (N/A )  Patient location during evaluation: Mother Baby Anesthesia Type: Epidural Level of consciousness: awake and alert Pain management: pain level controlled Vital Signs Assessment: post-procedure vital signs reviewed and stable Respiratory status: spontaneous breathing, nonlabored ventilation and respiratory function stable Cardiovascular status: stable Postop Assessment: no headache, no backache and epidural receding Anesthetic complications: no     Last Vitals:  Vitals:   03/10/20 0358 03/10/20 0815  BP: (!) 95/55 (!) 102/49  Pulse: 85 89  Resp: 18 20  Temp: 36.9 C 36.9 C  SpO2: 98% 100%    Last Pain:  Vitals:   03/10/20 0815  TempSrc: Oral  PainSc: (P) 3                  Rica Mast

## 2020-03-10 NOTE — Anesthesia Postprocedure Evaluation (Signed)
Anesthesia Post Note  Patient: Jasmin Zimmerman  Procedure(s) Performed: AN AD HOC LABOR EPIDURAL  Patient location during evaluation: Mother Baby Anesthesia Type: Epidural Level of consciousness: awake and alert Pain management: pain level controlled Vital Signs Assessment: post-procedure vital signs reviewed and stable Respiratory status: spontaneous breathing, nonlabored ventilation and respiratory function stable Cardiovascular status: stable Postop Assessment: no headache, no backache and epidural receding Anesthetic complications: no     Last Vitals:  Vitals:   03/10/20 0358 03/10/20 0815  BP: (!) 95/55 (!) 102/49  Pulse: 85 89  Resp: 18 20  Temp: 36.9 C 36.9 C  SpO2: 98% 100%    Last Pain:  Vitals:   03/10/20 0815  TempSrc: Oral  PainSc: (P) 3                  Rica Mast

## 2020-03-10 NOTE — Lactation Note (Signed)
This note was copied from a baby's chart. Lactation Consultation Note  Patient Name: Jasmin Zimmerman Date: 03/10/2020 Reason for consult: Follow-up assessment;Mother's request;Primapara;1st time breastfeeding;Term  LC called in by mom for assistance and questions. Mom reports baby has been feeding "all the time", and every time she puts baby down he begins to cue again. Mom had just re-latched baby to the left breast; the breast she feels most comfortable getting him into position on. Baby had good latch on nipple shield but sucking was intermittent and light. When asked about feeding on right breast, mom reports no feeds as she has not been comfortable, but open to assistance with getting him into position on this side. LC adjusted pillow support, repositioned the nipple shield, and assisted with transitioning baby to the right breast. With some initial hesitation baby did accept the breast, again though the sucks were not consistent and he had to continually be stimulated. With baby at the breast, LC educated mom on differences between hunger and comfort needs, benefits of skin to skin for calming/soothing or determining if baby is hungry, and importance of keeping baby stimulated at the breast for a full feeding. Encouraged to place baby to skin to skin on chest and rest, baby brought to chest and fell asleep immediately. Mom was reassured however with colostrum visible in the nipple shield.  Reviewed early hunger cues, cluster feeding/growth spurts, signs of good latch and milk transfer, and output expectations for DOL 2. Encouraged mom to call out with continued questions or for any assistance.  Maternal Data Formula Feeding for Exclusion: No Has patient been taught Hand Expression?: Yes Does the patient have breastfeeding experience prior to this delivery?: No  Feeding Feeding Type: Breast Fed  LATCH Score Latch: Grasps breast easily, tongue down, lips flanged, rhythmical  sucking.  Audible Swallowing: A few with stimulation  Type of Nipple: Everted at rest and after stimulation  Comfort (Breast/Nipple): Soft / non-tender  Hold (Positioning): Assistance needed to correctly position infant at breast and maintain latch.  LATCH Score: 8  Interventions Interventions: Breast feeding basics reviewed;Assisted with latch;Adjust position;Hand express;Support pillows;Position options  Lactation Tools Discussed/Used Tools: Nipple Shields Nipple shield size: 16   Consult Status Consult Status: PRN Date: 03/10/20 Follow-up type: Call as needed    [name] 03/10/2020, 10:50 AM

## 2020-03-11 MED ORDER — BENZOCAINE-MENTHOL 20-0.5 % EX AERO: 1.0000 "application " | INHALATION_SPRAY | CUTANEOUS | Status: DC | PRN

## 2020-03-11 MED ORDER — WITCH HAZEL-GLYCERIN EX PADS: 1.0000 "application " | MEDICATED_PAD | Freq: Four times a day (QID) | CUTANEOUS | 12 refills | Status: DC

## 2020-03-11 MED ORDER — SENNOSIDES-DOCUSATE SODIUM 8.6-50 MG PO TABS: 2.0000 | ORAL_TABLET | ORAL | Status: DC

## 2020-03-11 MED ORDER — IBUPROFEN 600 MG PO TABS: 600.0000 mg | ORAL_TABLET | Freq: Four times a day (QID) | ORAL | 0 refills | Status: DC

## 2020-03-11 MED ORDER — ACETAMINOPHEN 325 MG PO TABS: 650.0000 mg | ORAL_TABLET | ORAL | Status: AC | PRN

## 2020-03-11 NOTE — Lactation Note (Signed)
This note was copied from a baby's chart. Lactation Consultation Note  Patient Name: Jasmin Zimmerman [id number] Date: 03/11/2020 Reason for consult: Follow-up assessment  LC in to see mom and baby [facility] before discharge. Mom gave formula through out the night, no breastfeeding; but was actively breastfeeding when [facility] entered the room. Mom reports that baby took bottle well but did spit-up after every feeding. Mom plans to continue breastfeeding efforts, and working on getting comfortable feeding on right side. LC reviewed impact that formula and no breast stimulation may have on establishing an adequate milk supply for baby. Encouraged to offer the breast at each feeding prior to any supplement, or if bottle given to stimulate the breast through hand expression, hand pump/electric pump. Reviewed breastfeeding basics for the days/weeks to come, feeding patterns, growth spurts & cluster feeding, and output expectations. Reviewed with mom breast and nipple care, onset of breast fullness and differences in fullness vs engorgement, signs of plugged ducts and mastitis and when to seek MD help. Outpatient lactation services information and community breastfeeding resources. LC will send [facility] referral for notification of delivery and for ongoing breastfeeding support.  Maternal Data Formula Feeding for Exclusion: No Has patient been taught Hand Expression?: Yes Does the patient have breastfeeding experience prior to this delivery?: No  Feeding Feeding Type: Breast Fed  LATCH Score Latch: Grasps breast easily, tongue down, lips flanged, rhythmical sucking.  Audible Swallowing: Spontaneous and intermittent  Type of Nipple: Everted at rest and after stimulation  Comfort (Breast/Nipple): Soft / non-tender  Hold (Positioning): Assistance needed to correctly position infant at breast and maintain latch.  LATCH Score: 9  Interventions Interventions: Breast feeding basics reviewed  Lactation  Tools Discussed/Used Tools: Nipple Shields Nipple shield size: 16   Consult Status Consult Status: Complete    [name] 03/11/2020, 9:38 AM

## 2020-03-11 NOTE — Discharge Instructions (Signed)
Discharge Instructions:   Follow-up Appointment: Thursday, June 24th at 10:45am for postpartum laceration check with Heloise Ochoa, CNM at [facility] in [place]!   If there are any new medications, they have been ordered and will be available for pickup at the listed pharmacy on your way home from the hospital.   Call office if you have any of the following: headache, visual changes, fever >101.0 F, chills, shortness of breath, breast concerns, excessive vaginal bleeding, incision drainage or problems, leg pain or redness, depression or any other concerns. If you have vaginal discharge with an odor, let your doctor know.   It is normal to bleed for up to 6 weeks. You should not soak through more than 1 pad in 1 hour. If you have a blood clot larger than your fist with continued bleeding, call your doctor.   Activity: Do not lift > 10 lbs for 6 weeks (do not lift anything heavier than your baby). No intercourse, tampons, swimming pools, hot tubs, baths (only showers) for 6 weeks.  No driving for 1-2 weeks. Continue prenatal vitamin, especially if breastfeeding. Increase calories and fluids (water) while breastfeeding.   Your milk will come in, in the next couple of days (right now it is colostrum). You may have a slight fever when your milk comes in, but it should go away on its own.  If it does not, and rises above 101 F please call the doctor. You will also feel achy and your breasts will be firm. They will also start to leak. If you are breastfeeding, continue as you have been and you can pump/express milk for comfort.   If you have too much milk, your breasts can become engorged, which could lead to mastitis. This is an infection of the milk ducts. It can be very painful and you will need to notify your doctor to obtain a prescription for antibiotics. You can also treat it with a shower or hot/cold compress.   For concerns about your baby, please call your pediatrician.  For  breastfeeding concerns, the lactation consultant can be reached at [phone].   Postpartum blues (feelings of happy one minute and sad another minute) are normal for the first few weeks but if it gets worse let your doctor know.   Congratulations! We enjoyed caring for you and your new bundle of joy!

## 2020-03-11 NOTE — Progress Notes (Signed)
Post Partum Day 2 Subjective: Doing well, no complaints.  Tolerating regular diet, pain with PO meds, voiding and ambulating without difficulty.  No CP SOB Fever,Chills, N/V or leg pain; denies nipple or breast pain no HA change of vision, RUQ/epigastric pain  Objective: BP 121/62 (BP Location: Left Arm)   Pulse 92   Temp 98 F (36.7 C)   Resp 20   Ht 5\' 1"  (1.549 m)   Wt 97.5 kg   LMP 06/16/2019 (Approximate) Comment: normal  SpO2 100%   Breastfeeding Unknown   BMI 40.62 kg/m    Physical Exam:  General: NAD Breasts: soft/nontender Abdomen: soft, NT, BS x 4 Perineum: minimal edema, laceration repair well approximated Lochia: small Uterine Fundus: fundus firm and 2 fb below umbilicus DVT Evaluation: no cords, ttp LEs   Recent Labs    03/09/20 2002 03/10/20 0433  HGB 9.8* 9.0*  HCT 29.8* 27.4*  WBC 20.8* 16.2*  PLT 242 224    Assessment/Plan: 19 y.o. G1P1001 postpartum day # 2  - Continue routine PP care - Lactation consult prn - Discussed contraceptive options including implant, IUDs hormonal and non-hormonal, injection, pills/ring/patch, condoms, and NFP. Pt plans to use pills - Acute blood loss anemia - hemodynamically stable and asymptomatic; continue po ferrous sulfate BID with stool softeners  - Immunization status:  all Imms up to date per [facility] recs    Disposition: Doesdesire Dc home today.     05/10/20, CNM 03/11/2020  9:36 AM

## 2020-03-11 NOTE — Progress Notes (Signed)
Patient discharged home with infant. Discharge instructions and prescriptions given and reviewed with patient. Patient verbalized understanding. Escorted out by staff.  

## 2020-03-13 ENCOUNTER — Inpatient Hospital Stay
Admission: AD | Admit: 2020-03-13 | Discharge: 2020-03-16 | DRG: 769 | Disposition: A | Discharge status: Home / Self Care | Payer: Medicaid Other | Attending: Obstetrics and Gynecology | Admitting: Obstetrics and Gynecology

## 2020-03-13 ENCOUNTER — Encounter: Payer: Self-pay | Admitting: Emergency Medicine

## 2020-03-13 ENCOUNTER — Other Ambulatory Visit: Payer: Self-pay

## 2020-03-13 DIAGNOSIS — Z20822 Contact with and (suspected) exposure to covid-19: Secondary | ICD-10-CM | POA: Diagnosis present

## 2020-03-13 DIAGNOSIS — T8130XA Disruption of wound, unspecified, initial encounter: Secondary | ICD-10-CM

## 2020-03-13 DIAGNOSIS — K5900 Constipation, unspecified: Secondary | ICD-10-CM | POA: Diagnosis present

## 2020-03-13 DIAGNOSIS — D649 Anemia, unspecified: Secondary | ICD-10-CM | POA: Diagnosis present

## 2020-03-13 DIAGNOSIS — O9081 Anemia of the puerperium: Secondary | ICD-10-CM | POA: Diagnosis present

## 2020-03-13 DIAGNOSIS — T8140XA Infection following a procedure, unspecified, initial encounter: Secondary | ICD-10-CM

## 2020-03-13 DIAGNOSIS — G8918 Other acute postprocedural pain: Secondary | ICD-10-CM

## 2020-03-13 DIAGNOSIS — O8612 Endometritis following delivery: Secondary | ICD-10-CM | POA: Diagnosis present

## 2020-03-13 DIAGNOSIS — O901 Disruption of perineal obstetric wound: Secondary | ICD-10-CM | POA: Diagnosis present

## 2020-03-13 LAB — BASIC METABOLIC PANEL
Anion gap: 8 (ref 5–15)
BUN: 14 mg/dL (ref 6–20)
CO2: 23 mmol/L (ref 22–32)
Calcium: 7.8 mg/dL — ABNORMAL LOW (ref 8.9–10.3)
Chloride: 108 mmol/L (ref 98–111)
Creatinine, Ser: 0.65 mg/dL (ref 0.44–1.00)
GFR calc Af Amer: >60 mL/min (ref >60)
GFR calc non Af Amer: >60 mL/min (ref >60)
Glucose, Bld: 102 mg/dL — ABNORMAL HIGH (ref 70–99)
Potassium: 3.3 mmol/L — ABNORMAL LOW (ref 3.5–5.1)
Sodium: 139 mmol/L (ref 135–145)

## 2020-03-13 NOTE — ED Triage Notes (Signed)
Per [facility], pt is four days post partum and had to be taken to the OR to stitch a tear. Pt denies any more vaginal bleeding at this time and was sitting when the pain started. Per EMS, pt's VS WDL and pt was 10/10 pain but is now 0/10 after 100 mcg Fentanyl and 4 mg Zofran and 500 bolus with EMS.

## 2020-03-14 ENCOUNTER — Observation Stay: Payer: Medicaid Other | Admitting: Certified Registered"

## 2020-03-14 ENCOUNTER — Encounter: Payer: Self-pay | Admitting: Radiology

## 2020-03-14 ENCOUNTER — Encounter
Admission: AD | Disposition: A | Discharge status: Home / Self Care | Payer: Self-pay | Source: Home / Self Care | Attending: Obstetrics and Gynecology

## 2020-03-14 ENCOUNTER — Emergency Department: Payer: Medicaid Other

## 2020-03-14 DIAGNOSIS — O8612 Endometritis following delivery: Secondary | ICD-10-CM | POA: Diagnosis present

## 2020-03-14 DIAGNOSIS — R102 Pelvic and perineal pain: Secondary | ICD-10-CM | POA: Diagnosis present

## 2020-03-14 DIAGNOSIS — O901 Disruption of perineal obstetric wound: Secondary | ICD-10-CM | POA: Diagnosis present

## 2020-03-14 DIAGNOSIS — D649 Anemia, unspecified: Secondary | ICD-10-CM | POA: Diagnosis present

## 2020-03-14 DIAGNOSIS — Z20822 Contact with and (suspected) exposure to covid-19: Secondary | ICD-10-CM | POA: Diagnosis present

## 2020-03-14 DIAGNOSIS — K5900 Constipation, unspecified: Secondary | ICD-10-CM | POA: Diagnosis present

## 2020-03-14 DIAGNOSIS — O9081 Anemia of the puerperium: Secondary | ICD-10-CM | POA: Diagnosis present

## 2020-03-14 HISTORY — DX: Endometritis following delivery: O86.12

## 2020-03-14 HISTORY — PX: PERINEAL LACERATION REPAIR: SHX5389

## 2020-03-14 LAB — CBC
HCT: 24.8 % — ABNORMAL LOW (ref 36.0–46.0)
HCT: 29.3 % — ABNORMAL LOW (ref 36.0–46.0)
Hemoglobin: 7.9 g/dL — ABNORMAL LOW (ref 12.0–15.0)
Hemoglobin: 9.5 g/dL — ABNORMAL LOW (ref 12.0–15.0)
MCH: 27.1 pg (ref 26.0–34.0)
MCH: 27.6 pg (ref 26.0–34.0)
MCHC: 31.9 g/dL (ref 30.0–36.0)
MCHC: 32.4 g/dL (ref 30.0–36.0)
MCV: 85.2 fL (ref 80.0–100.0)
MCV: 85.2 fL (ref 80.0–100.0)
Platelets: 313 10*3/uL (ref 150–400)
Platelets: 319 10*3/uL (ref 150–400)
RBC: 2.91 MIL/uL — ABNORMAL LOW (ref 3.87–5.11)
RBC: 3.44 MIL/uL — ABNORMAL LOW (ref 3.87–5.11)
RDW: 24 % — ABNORMAL HIGH (ref 11.5–15.5)
RDW: 22.3 % — ABNORMAL HIGH (ref 11.5–15.5)
WBC: 8.5 10*3/uL (ref 4.0–10.5)
WBC: 13.8 10*3/uL — ABNORMAL HIGH (ref 4.0–10.5)
nRBC: 0.6 % — ABNORMAL HIGH (ref 0.0–0.2)
nRBC: 0.1 % (ref 0.0–0.2)

## 2020-03-14 LAB — URINALYSIS, COMPLETE (UACMP) WITH MICROSCOPIC
Bacteria, UA: NONE SEEN
Bilirubin Urine: NEGATIVE
Glucose, UA: NEGATIVE mg/dL
Ketones, ur: NEGATIVE mg/dL
Nitrite: NEGATIVE
Protein, ur: 30 mg/dL — AB
RBC / HPF: >50 RBC/hpf — ABNORMAL HIGH (ref 0–5)
Specific Gravity, Urine: 1.03 (ref 1.005–1.030)
WBC, UA: >50 WBC/hpf — ABNORMAL HIGH (ref 0–5)
pH: 7 (ref 5.0–8.0)

## 2020-03-14 LAB — COMPREHENSIVE METABOLIC PANEL
ALT: 29 U/L (ref 0–44)
AST: 38 U/L (ref 15–41)
Albumin: 2.4 g/dL — ABNORMAL LOW (ref 3.5–5.0)
Alkaline Phosphatase: 93 U/L (ref 38–126)
Anion gap: 5 (ref 5–15)
BUN: 10 mg/dL (ref 6–20)
CO2: 27 mmol/L (ref 22–32)
Calcium: 8.3 mg/dL — ABNORMAL LOW (ref 8.9–10.3)
Chloride: 106 mmol/L (ref 98–111)
Creatinine, Ser: 0.57 mg/dL (ref 0.44–1.00)
GFR calc Af Amer: >60 mL/min (ref >60)
GFR calc non Af Amer: >60 mL/min (ref >60)
Glucose, Bld: 110 mg/dL — ABNORMAL HIGH (ref 70–99)
Potassium: 4.2 mmol/L (ref 3.5–5.1)
Sodium: 138 mmol/L (ref 135–145)
Total Bilirubin: 0.5 mg/dL (ref 0.3–1.2)
Total Protein: 6 g/dL — ABNORMAL LOW (ref 6.5–8.1)

## 2020-03-14 LAB — CBC WITH DIFFERENTIAL/PLATELET
Abs Immature Granulocytes: 0.09 10*3/uL — ABNORMAL HIGH (ref 0.00–0.07)
Basophils Absolute: 0 10*3/uL (ref 0.0–0.1)
Basophils Relative: 0 %
Eosinophils Absolute: 0.2 10*3/uL (ref 0.0–0.5)
Eosinophils Relative: 2 %
HCT: 23.8 % — ABNORMAL LOW (ref 36.0–46.0)
Hemoglobin: 7.9 g/dL — ABNORMAL LOW (ref 12.0–15.0)
Immature Granulocytes: 1 %
Lymphocytes Relative: 19 %
Lymphs Abs: 1.7 10*3/uL (ref 0.7–4.0)
MCH: 27.6 pg (ref 26.0–34.0)
MCHC: 33.2 g/dL (ref 30.0–36.0)
MCV: 83.2 fL (ref 80.0–100.0)
Monocytes Absolute: 0.8 10*3/uL (ref 0.1–1.0)
Monocytes Relative: 9 %
Neutro Abs: 6.4 10*3/uL (ref 1.7–7.7)
Neutrophils Relative %: 69 %
Platelets: 296 10*3/uL (ref 150–400)
RBC: 2.86 MIL/uL — ABNORMAL LOW (ref 3.87–5.11)
RDW: 23.7 % — ABNORMAL HIGH (ref 11.5–15.5)
WBC: 9.2 10*3/uL (ref 4.0–10.5)
nRBC: 0.8 % — ABNORMAL HIGH (ref 0.0–0.2)

## 2020-03-14 LAB — SARS CORONAVIRUS 2 BY RT PCR (HOSPITAL ORDER, PERFORMED IN ~~LOC~~ HOSPITAL LAB): SARS Coronavirus 2: NEGATIVE

## 2020-03-14 LAB — LACTIC ACID, PLASMA: Lactic Acid, Venous: 0.9 mmol/L (ref 0.5–1.9)

## 2020-03-14 SURGERY — EXAM UNDER ANESTHESIA
Anesthesia: General

## 2020-03-14 MED ORDER — SODIUM CHLORIDE 0.9% FLUSH: 3.0000 mL | INTRAVENOUS | Status: DC | PRN

## 2020-03-14 MED ORDER — ONDANSETRON HCL 4 MG/2ML IJ SOLN
INTRAMUSCULAR | Status: DC | PRN
  Administered 2020-03-14: 4 mg via INTRAVENOUS

## 2020-03-14 MED ORDER — PROPOFOL 10 MG/ML IV BOLUS
INTRAVENOUS | Status: DC | PRN
  Administered 2020-03-14: 150 mg via INTRAVENOUS
  Administered 2020-03-14: 100 mg via INTRAVENOUS

## 2020-03-14 MED ORDER — WITCH HAZEL-GLYCERIN EX PADS: 1.0000 "application " | MEDICATED_PAD | CUTANEOUS | Status: DC | PRN

## 2020-03-14 MED ORDER — FENTANYL CITRATE (PF) 100 MCG/2ML IJ SOLN
25.0000 ug | INTRAMUSCULAR | Status: DC | PRN
  Administered 2020-03-14 (×6): 25 ug via INTRAVENOUS

## 2020-03-14 MED ORDER — SODIUM CHLORIDE 0.9 % IV SOLN
8.0000 mg | Freq: Three times a day (TID) | INTRAVENOUS | Status: DC | PRN
  Filled 2020-03-14: qty 4

## 2020-03-14 MED ORDER — ACETAMINOPHEN 500 MG PO TABS
1000.0000 mg | ORAL_TABLET | Freq: Four times a day (QID) | ORAL | Status: DC
  Administered 2020-03-15 – 2020-03-16 (×6): 1000 mg via ORAL
  Filled 2020-03-14 (×6): qty 2

## 2020-03-14 MED ORDER — GENTAMICIN SULFATE 40 MG/ML IJ SOLN
5.0000 mg/kg | INTRAVENOUS | Status: DC
  Filled 2020-03-14: qty 11.75

## 2020-03-14 MED ORDER — DEXAMETHASONE SODIUM PHOSPHATE 10 MG/ML IJ SOLN
INTRAMUSCULAR | Status: DC | PRN
  Administered 2020-03-14: 10 mg via INTRAVENOUS

## 2020-03-14 MED ORDER — POLYETHYLENE GLYCOL 3350 17 G PO PACK
17.0000 g | PACK | Freq: Every day | ORAL | Status: DC
  Administered 2020-03-15 – 2020-03-16 (×2): 17 g via ORAL
  Filled 2020-03-14 (×3): qty 1

## 2020-03-14 MED ORDER — ACETAMINOPHEN 500 MG PO TABS
1000.0000 mg | ORAL_TABLET | Freq: Four times a day (QID) | ORAL | Status: DC | PRN
  Administered 2020-03-14: 1000 mg via ORAL
  Filled 2020-03-14 (×2): qty 2

## 2020-03-14 MED ORDER — BENZOCAINE-MENTHOL 20-0.5 % EX AERO
1.0000 "application " | INHALATION_SPRAY | CUTANEOUS | Status: DC | PRN
  Filled 2020-03-14: qty 56

## 2020-03-14 MED ORDER — LACTATED RINGERS IV SOLN: INTRAVENOUS | Status: DC

## 2020-03-14 MED ORDER — MENTHOL 3 MG MT LOZG
1.0000 | LOZENGE | OROMUCOSAL | Status: DC | PRN
  Administered 2020-03-15: 3 mg via ORAL
  Filled 2020-03-14: qty 9

## 2020-03-14 MED ORDER — GENTAMICIN SULFATE 40 MG/ML IJ SOLN: 5.0000 mg/kg | INTRAVENOUS | Status: DC

## 2020-03-14 MED ORDER — ROCURONIUM BROMIDE 100 MG/10ML IV SOLN
INTRAVENOUS | Status: DC | PRN
  Administered 2020-03-14: 50 mg via INTRAVENOUS

## 2020-03-14 MED ORDER — OXYCODONE HCL 5 MG PO TABS
5.0000 mg | ORAL_TABLET | ORAL | Status: DC | PRN
  Administered 2020-03-15 – 2020-03-16 (×6): 5 mg via ORAL
  Filled 2020-03-14 (×6): qty 1

## 2020-03-14 MED ORDER — FENTANYL CITRATE (PF) 100 MCG/2ML IJ SOLN
INTRAMUSCULAR | Status: AC
  Filled 2020-03-14: qty 2

## 2020-03-14 MED ORDER — ONDANSETRON HCL 4 MG/2ML IJ SOLN
4.0000 mg | INTRAMUSCULAR | Status: AC
  Administered 2020-03-14: 4 mg via INTRAVENOUS
  Filled 2020-03-14: qty 2

## 2020-03-14 MED ORDER — SUGAMMADEX SODIUM 200 MG/2ML IV SOLN
INTRAVENOUS | Status: DC | PRN
  Administered 2020-03-14: 200 mg via INTRAVENOUS

## 2020-03-14 MED ORDER — FENTANYL CITRATE (PF) 100 MCG/2ML IJ SOLN
INTRAMUSCULAR | Status: DC | PRN
  Administered 2020-03-14: 50 ug via INTRAVENOUS
  Administered 2020-03-14: 100 ug via INTRAVENOUS
  Administered 2020-03-14: 50 ug via INTRAVENOUS

## 2020-03-14 MED ORDER — THROMBIN 5000 UNITS EX SOLR
CUTANEOUS | Status: AC
  Filled 2020-03-14: qty 5000

## 2020-03-14 MED ORDER — SODIUM CHLORIDE 0.9% FLUSH
3.0000 mL | Freq: Two times a day (BID) | INTRAVENOUS | Status: DC
  Administered 2020-03-15: 3 mL via INTRAVENOUS

## 2020-03-14 MED ORDER — HYDROMORPHONE HCL 1 MG/ML IJ SOLN
1.0000 mg | INTRAMUSCULAR | Status: DC | PRN
  Administered 2020-03-15: 1 mg via INTRAVENOUS
  Filled 2020-03-14: qty 1

## 2020-03-14 MED ORDER — SENNA 8.6 MG PO TABS
1.0000 | ORAL_TABLET | Freq: Every day | ORAL | Status: DC | PRN
  Filled 2020-03-14: qty 1

## 2020-03-14 MED ORDER — SUCCINYLCHOLINE CHLORIDE 20 MG/ML IJ SOLN
INTRAMUSCULAR | Status: DC | PRN
  Administered 2020-03-14: 100 mg via INTRAVENOUS

## 2020-03-14 MED ORDER — GENTAMICIN SULFATE 40 MG/ML IJ SOLN
5.0000 mg/kg | INTRAVENOUS | Status: DC
  Administered 2020-03-14 – 2020-03-16 (×3): 470 mg via INTRAVENOUS
  Filled 2020-03-14 (×4): qty 11.75

## 2020-03-14 MED ORDER — SODIUM CHLORIDE 0.9 % IV SOLN
2.0000 g | Freq: Once | INTRAVENOUS | Status: AC
  Administered 2020-03-14: 2 g via INTRAVENOUS
  Filled 2020-03-14: qty 2000

## 2020-03-14 MED ORDER — LIDOCAINE HCL (CARDIAC) PF 100 MG/5ML IV SOSY
PREFILLED_SYRINGE | INTRAVENOUS | Status: DC | PRN
  Administered 2020-03-14: 80 mg via INTRAVENOUS

## 2020-03-14 MED ORDER — PROPOFOL 500 MG/50ML IV EMUL
INTRAVENOUS | Status: AC
  Filled 2020-03-14: qty 50

## 2020-03-14 MED ORDER — IBUPROFEN 600 MG PO TABS
600.0000 mg | ORAL_TABLET | Freq: Four times a day (QID) | ORAL | Status: DC | PRN
  Administered 2020-03-14: 600 mg via ORAL
  Filled 2020-03-14 (×2): qty 1

## 2020-03-14 MED ORDER — IOHEXOL 300 MG/ML  SOLN
100.0000 mL | Freq: Once | INTRAMUSCULAR | Status: AC | PRN
  Administered 2020-03-14: 100 mL via INTRAVENOUS

## 2020-03-14 MED ORDER — THROMBIN 5000 UNITS EX SOLR: OROMUCOSAL | Status: DC | PRN

## 2020-03-14 MED ORDER — SODIUM CHLORIDE 0.9 % IV SOLN
2.0000 g | Freq: Four times a day (QID) | INTRAVENOUS | Status: DC
  Administered 2020-03-14 – 2020-03-16 (×8): 2 g via INTRAVENOUS
  Filled 2020-03-14 (×2): qty 2
  Filled 2020-03-14 (×4): qty 2000
  Filled 2020-03-14: qty 2
  Filled 2020-03-14: qty 2000
  Filled 2020-03-14 (×2): qty 2
  Filled 2020-03-14: qty 2000

## 2020-03-14 MED ORDER — ESTROGENS, CONJUGATED 0.625 MG/GM VA CREA
TOPICAL_CREAM | VAGINAL | Status: AC
  Filled 2020-03-14: qty 30

## 2020-03-14 MED ORDER — ONDANSETRON HCL 4 MG/2ML IJ SOLN
4.0000 mg | Freq: Three times a day (TID) | INTRAMUSCULAR | Status: DC | PRN
  Administered 2020-03-14: 4 mg via INTRAVENOUS
  Filled 2020-03-14: qty 2

## 2020-03-14 MED ORDER — MORPHINE SULFATE (PF) 4 MG/ML IV SOLN
4.0000 mg | Freq: Once | INTRAVENOUS | Status: AC
  Administered 2020-03-14: 4 mg via INTRAVENOUS
  Filled 2020-03-14: qty 1

## 2020-03-14 MED ORDER — KETOROLAC TROMETHAMINE 15 MG/ML IJ SOLN
INTRAMUSCULAR | Status: AC
  Administered 2020-03-14: 15 mg
  Filled 2020-03-14: qty 1

## 2020-03-14 MED ORDER — METHYLENE BLUE 0.5 % INJ SOLN
INTRAVENOUS | Status: DC | PRN
  Administered 2020-03-14: 2 mL

## 2020-03-14 MED ORDER — CLINDAMYCIN PHOSPHATE 900 MG/50ML IV SOLN
900.0000 mg | Freq: Once | INTRAVENOUS | Status: AC
  Administered 2020-03-14: 900 mg via INTRAVENOUS
  Filled 2020-03-14: qty 50

## 2020-03-14 MED ORDER — CELECOXIB 200 MG PO CAPS
200.0000 mg | ORAL_CAPSULE | Freq: Two times a day (BID) | ORAL | Status: DC
  Administered 2020-03-14 – 2020-03-16 (×4): 200 mg via ORAL
  Filled 2020-03-14 (×6): qty 1

## 2020-03-14 MED ORDER — MORPHINE SULFATE (PF) 4 MG/ML IV SOLN
4.0000 mg | INTRAVENOUS | Status: DC | PRN
  Administered 2020-03-14 (×2): 4 mg via INTRAVENOUS
  Filled 2020-03-14 (×2): qty 1

## 2020-03-14 MED ORDER — METHYLENE BLUE 0.5 % INJ SOLN
INTRAVENOUS | Status: AC
  Filled 2020-03-14: qty 10

## 2020-03-14 MED ORDER — ONDANSETRON HCL 4 MG/2ML IJ SOLN
INTRAMUSCULAR | Status: AC
  Filled 2020-03-14: qty 2

## 2020-03-14 MED ORDER — DEXAMETHASONE SODIUM PHOSPHATE 10 MG/ML IJ SOLN
INTRAMUSCULAR | Status: AC
  Filled 2020-03-14: qty 1

## 2020-03-14 MED ORDER — CLINDAMYCIN PHOSPHATE 900 MG/50ML IV SOLN
900.0000 mg | Freq: Three times a day (TID) | INTRAVENOUS | Status: DC
  Administered 2020-03-14 – 2020-03-16 (×6): 900 mg via INTRAVENOUS
  Filled 2020-03-14 (×9): qty 50

## 2020-03-14 MED ORDER — ACETAMINOPHEN 10 MG/ML IV SOLN
1000.0000 mg | Freq: Once | INTRAVENOUS | Status: AC | PRN
  Filled 2020-03-14: qty 100

## 2020-03-14 MED ORDER — ACETAMINOPHEN 10 MG/ML IV SOLN
INTRAVENOUS | Status: AC
  Administered 2020-03-14: 1000 mg
  Filled 2020-03-14: qty 100

## 2020-03-14 MED ORDER — MIDAZOLAM HCL 2 MG/2ML IJ SOLN
INTRAMUSCULAR | Status: DC | PRN
  Administered 2020-03-14: 2 mg via INTRAVENOUS

## 2020-03-14 MED ORDER — GABAPENTIN 300 MG PO CAPS
900.0000 mg | ORAL_CAPSULE | Freq: Every day | ORAL | Status: DC
  Administered 2020-03-15: 900 mg via ORAL
  Filled 2020-03-14 (×2): qty 3

## 2020-03-14 MED ORDER — MIDAZOLAM HCL 2 MG/2ML IJ SOLN
INTRAMUSCULAR | Status: AC
  Filled 2020-03-14: qty 2

## 2020-03-14 MED ORDER — KETOROLAC TROMETHAMINE 30 MG/ML IJ SOLN
15.0000 mg | Freq: Once | INTRAMUSCULAR | Status: AC
  Administered 2020-03-14: 15 mg via INTRAVENOUS

## 2020-03-14 MED ORDER — ESTROGENS, CONJUGATED 0.625 MG/GM VA CREA
TOPICAL_CREAM | VAGINAL | Status: DC | PRN
  Administered 2020-03-14: 1 via VAGINAL

## 2020-03-14 MED ORDER — TRANEXAMIC ACID 1000 MG/10ML IV SOLN
INTRAVENOUS | Status: AC
  Filled 2020-03-14: qty 10

## 2020-03-14 MED ORDER — SODIUM CHLORIDE 0.9 % IV SOLN: INTRAVENOUS | Status: DC

## 2020-03-14 MED ORDER — WITCH HAZEL-GLYCERIN EX PADS: 1.0000 "application " | MEDICATED_PAD | Freq: Four times a day (QID) | CUTANEOUS | Status: DC

## 2020-03-14 MED ORDER — SODIUM CHLORIDE 0.9 % IV SOLN
250.0000 mL | INTRAVENOUS | Status: DC | PRN
  Administered 2020-03-14: 250 mL via INTRAVENOUS

## 2020-03-14 MED ORDER — ONDANSETRON HCL 4 MG/2ML IJ SOLN: 4.0000 mg | Freq: Once | INTRAMUSCULAR | Status: DC | PRN

## 2020-03-14 SURGICAL SUPPLY — 23 items
COUNTER NEEDLE 20/40 LG (NEEDLE) ×4 IMPLANT
COVER WAND RF STERILE (DRAPES) ×4 IMPLANT
CUP MEDICINE 2OZ PLAST GRAD ST (MISCELLANEOUS) ×4 IMPLANT
GAUZE 4X4 16PLY RFD (DISPOSABLE) ×4 IMPLANT
GAUZE PACK 2X3YD (GAUZE/BANDAGES/DRESSINGS) ×4 IMPLANT
GLOVE BIO SURGEON STRL SZ7 (GLOVE) ×4 IMPLANT
GOWN STRL REUS W/ TWL LRG LVL3 (GOWN DISPOSABLE) ×2 IMPLANT
GOWN STRL REUS W/TWL LRG LVL3 (GOWN DISPOSABLE) ×2
HANDLE YANKAUER SUCT BULB TIP (MISCELLANEOUS) ×4 IMPLANT
KIT TURNOVER CYSTO (KITS) ×4 IMPLANT
LABEL OR SOLS (LABEL) IMPLANT
NS IRRIG 500ML POUR BTL (IV SOLUTION) ×4 IMPLANT
PACK DNC HYST (MISCELLANEOUS) ×4 IMPLANT
PAD OB MATERNITY 4.3X12.25 (PERSONAL CARE ITEMS) ×4 IMPLANT
PAD PREP 24X41 OB/GYN DISP (PERSONAL CARE ITEMS) ×4 IMPLANT
SOL PREP PVP 2OZ (MISCELLANEOUS)
SOLUTION PREP PVP 2OZ (MISCELLANEOUS) IMPLANT
SURGIFLO W/THROMBIN 8M KIT (HEMOSTASIS) ×4 IMPLANT
SURGILUBE 2OZ TUBE FLIPTOP (MISCELLANEOUS) ×4 IMPLANT
SUT MNCRL 3-0 UNDYED SH (SUTURE) ×6 IMPLANT
SUT MON AB 2-0 CT1 36 (SUTURE) ×4 IMPLANT
SUT MONOCRYL 3-0 UNDYED (SUTURE) ×6
TOWEL OR 17X26 4PK STRL BLUE (TOWEL DISPOSABLE) ×4 IMPLANT

## 2020-03-14 NOTE — Anesthesia Procedure Notes (Signed)
Procedure Name: Intubation Date/Time: 03/14/2020 2:07 PM Performed by: Katherine Basset, CRNA Pre-anesthesia Checklist: Patient identified, Emergency Drugs available, Suction available and Patient being monitored Patient Re-evaluated:Patient Re-evaluated prior to induction Oxygen Delivery Method: Circle system utilized Preoxygenation: Pre-oxygenation with 100% oxygen Induction Type: IV induction Ventilation: Mask ventilation without difficulty Laryngoscope Size: Miller and 2 Grade View: Grade I Tube type: Oral Tube size: 7.0 mm Number of attempts: 1 Airway Equipment and Method: Stylet,  Oral airway and Bite block Placement Confirmation: ETT inserted through vocal cords under direct vision,  positive ETCO2 and breath sounds checked- equal and bilateral Secured at: 21 cm Tube secured with: Tape Dental Injury: Teeth and Oropharynx as per pre-operative assessment

## 2020-03-14 NOTE — Anesthesia Preprocedure Evaluation (Signed)
Anesthesia Evaluation  Patient identified by MRN, date of birth, ID band Patient awake    Reviewed: Allergy & Precautions, H&P , NPO status , Patient's Chart, lab work & pertinent test results, reviewed documented beta blocker date and time   Airway Mallampati: II  TM Distance: >3 FB Neck ROM: full    Dental  (+) Teeth Intact   Pulmonary neg pulmonary ROS,    Pulmonary exam normal        Cardiovascular Exercise Tolerance: Good negative cardio ROS Normal cardiovascular exam Rhythm:regular Rate:Normal     Neuro/Psych negative neurological ROS  negative psych ROS   GI/Hepatic negative GI ROS, Neg liver ROS,   Endo/Other  negative endocrine ROS  Renal/GU negative Renal ROS  negative genitourinary   Musculoskeletal   Abdominal   Peds  Hematology  (+) Blood dyscrasia, anemia ,   Anesthesia Other Findings Past Medical History: 01/22/2020: Anemia affecting pregnancy in third trimester 08/27/2019: Biliary colic     Comment:  @ ED-small gallstones seem Past Surgical History: No date: left shoulder surgery; Left     Comment:  Left shoulder surgery in middle school, unsure date and               reason 03/09/2020: REPAIR VAGINAL CUFF; N/A     Comment:  Procedure: REPAIR VAGINAL CUFF;  Surgeon: Schermerhorn,               Ihor Austin, MD;  Location: [facility] ORS;  Service: Gynecology;               Laterality: N/A; 2018: WISDOM TOOTH EXTRACTION; Bilateral BMI    Body Mass Index: 39.03 kg/m     Reproductive/Obstetrics negative OB ROS                             Anesthesia Physical Anesthesia Plan  ASA: II and emergent  Anesthesia Plan: General ETT   Post-op Pain Management:    Induction:   PONV Risk Score and Plan:   Airway Management Planned:   Additional Equipment:   Intra-op Plan:   Post-operative Plan:   Informed Consent: I have reviewed the patients History and Physical, chart,  labs and discussed the procedure including the risks, benefits and alternatives for the proposed anesthesia with the patient or authorized representative who has indicated his/her understanding and acceptance.     Dental Advisory Given  Plan Discussed with: CRNA  Anesthesia Plan Comments:         Anesthesia Quick Evaluation

## 2020-03-14 NOTE — ED Notes (Signed)
Pt to CT at this time.

## 2020-03-14 NOTE — Transfer of Care (Signed)
Immediate Anesthesia Transfer of Care Note  Patient: Jasmin Zimmerman  Procedure(s) Performed: Francia Greaves UNDER ANESTHESIA (N/A ) SUTURE REPAIR PERINEAL LACERATION  Patient Location: PACU  Anesthesia Type:General  Level of Consciousness: awake and alert   Airway & Oxygen Therapy: Patient Spontanous Breathing  Post-op Assessment: Report given to RN and Post -op Vital signs reviewed and stable  Post vital signs: Reviewed and stable  Last Vitals:  Vitals Value Taken Time  BP 101/45 03/14/20 1555  Temp    Pulse 84 03/14/20 1558  Resp 13 03/14/20 1558  SpO2 100 % 03/14/20 1558  Vitals shown include unvalidated device data.  Last Pain:  Vitals:   03/14/20 1145  TempSrc:   PainSc: 2          Complications: No complications documented.

## 2020-03-14 NOTE — Anesthesia Postprocedure Evaluation (Signed)
Anesthesia Post Note  Patient: Jasmin Zimmerman  Procedure(s) Performed: EXAM UNDER ANESTHESIA (N/A ) SUTURE REPAIR PERINEAL LACERATION  Patient location during evaluation: PACU Anesthesia Type: General Level of consciousness: awake and alert Pain management: pain level controlled Vital Signs Assessment: post-procedure vital signs reviewed and stable Respiratory status: spontaneous breathing, nonlabored ventilation, respiratory function stable and patient connected to nasal cannula oxygen Cardiovascular status: blood pressure returned to baseline and stable Postop Assessment: no apparent nausea or vomiting Anesthetic complications: no   No complications documented.   Last Vitals:  Vitals:   03/14/20 1630 03/14/20 1640  BP:  (!) 96/56  Pulse: 71 64  Resp: 17 16  Temp:    SpO2: 98% 100%    Last Pain:  Vitals:   03/14/20 1640  TempSrc:   PainSc: Asleep                 Yevette Edwards

## 2020-03-14 NOTE — ED Provider Notes (Signed)
[facility] Emergency Department Provider Note  ____________________________________________   First MD Initiated Contact with Patient 03/13/20 2301     (approximate)  I have reviewed the triage vital signs and the nursing notes.   HISTORY  Chief Complaint Post-op Problem    HPI Jasmin Zimmerman is a 19 y.o. female G1, P1 approximately 1 week postpartum after a vaginal delivery that was complicated by severe vaginal lacerations requiring surgery by Dr. Feliberto Gottron to repair.  She presents tonight by EMS for evaluation of acute onset and severe sharp vaginal pain that occurred about 7:00 PM and continued.  She said is much worse than the mild pain she has been dealing with after the delivery and surgery.  She has been leaking a small amount of pinkish fluid that seems to be a little bit more than normal.  She has had no fever, shortness of breath, sore throat, nausea, vomiting, nor abdominal pain.  She received fentanyl IV in route to the hospital and her pain was mostly gone by the time she got here but is starting to come back by the time I evaluated her.  Nothing in particular makes his symptoms better or worse at this point.           Past Medical History:  Diagnosis Date  . Anemia affecting pregnancy in third trimester 01/22/2020  . Biliary colic 08/27/2019   @ ED-small gallstones seem    Patient Active Problem List   Diagnosis Date Noted  . Postpartum endometritis 03/14/2020  . PPH (postpartum hemorrhage) 03/09/2020  . NSVD (normal spontaneous vaginal delivery) 03/09/2020  . Anemia affecting pregnancy in third trimester 01/22/2020  . Encounter for antenatal screening for chromosomal anomalies   . Supervision of normal first pregnancy, antepartum 09/03/2019  . History of biliary colic 09/03/2019    Past Surgical History:  Procedure Laterality Date  . left shoulder surgery Left    Left shoulder surgery in middle school, unsure date and  reason  . REPAIR VAGINAL CUFF N/A 03/09/2020   Procedure: REPAIR VAGINAL CUFF;  Surgeon: Schermerhorn, Ihor Austin, MD;  Location: [facility] ORS;  Service: Gynecology;  Laterality: N/A;  . WISDOM TOOTH EXTRACTION Bilateral 2018    Prior to Admission medications   Medication Sig Start Date End Date Taking? Authorizing Provider  acetaminophen (TYLENOL) 325 MG tablet Take 2 tablets (650 mg total) by mouth every 4 (four) hours as needed (for pain scale < 4). 03/11/20  Yes McVey, Prudencio Pair, CNM  benzocaine-Menthol (DERMOPLAST) 20-0.5 % AERO Apply 1 application topically as needed for irritation (perineal discomfort). 03/11/20  Yes McVey, Prudencio Pair, CNM  calcium carbonate (TUMS - DOSED IN MG ELEMENTAL CALCIUM) 500 MG chewable tablet Chew 1 tablet by mouth daily.   Yes [provider]  ibuprofen (ADVIL) 600 MG tablet Take 1 tablet (600 mg total) by mouth every 6 (six) hours. 03/11/20  Yes McVey, Prudencio Pair, CNM  Prenatal Vit-Fe Fumarate-FA (MULTIVITAMIN-PRENATAL) 27-0.8 MG TABS tablet Take 1 tablet by mouth daily at 12 noon.   Yes [provider]  senna-docusate (SENOKOT-S) 8.6-50 MG tablet Take 2 tablets by mouth daily. 03/12/20  Yes McVey, Prudencio Pair, CNM  ferrous sulfate 324 (65 Fe) MG TBEC Take 1 tablet (325 mg total) by mouth in the morning, at noon, and at bedtime. 12/23/19 01/22/20  Federico Flake, MD  witch hazel-glycerin (TUCKS) pad Apply 1 application topically 4 (four) times daily. 03/11/20   McVey, Prudencio Pair, CNM    Allergies Patient  has no known allergies.  Family History  Problem Relation Age of Onset  . Diabetes Maternal Uncle   . Diabetes Maternal Grandmother   . Vision loss Maternal Grandmother   . Diabetes Maternal Grandfather   . Kidney disease Maternal Grandfather     Social History Social History   Tobacco Use  . Smoking status: Never Smoker  . Smokeless tobacco: Never Used  Vaping Use  . Vaping Use: Never used  Substance Use Topics  . Alcohol use: Never    . Drug use: Never    Review of Systems Constitutional: No fever/chills Eyes: No visual changes. ENT: No sore throat. Cardiovascular: Denies chest pain. Respiratory: Denies shortness of breath. Gastrointestinal: No abdominal pain.  No nausea, no vomiting.  No diarrhea.  No constipation. Genitourinary: Acute onset of severe sharp vaginal pain.  Some pinkish discharge but no obvious bleeding. Musculoskeletal: Negative for neck pain.  Negative for back pain. Integumentary: Negative for rash. Neurological: Negative for headaches, focal weakness or numbness.   ____________________________________________   PHYSICAL EXAM:  VITAL SIGNS: ED Triage Vitals  Enc Vitals Group     BP 03/13/20 2235 (!) 123/58     Pulse Rate 03/13/20 2235 88     Resp 03/13/20 2235 18     Temp 03/13/20 2235 98.3 F (36.8 C)     Temp Source 03/13/20 2235 Oral     SpO2 03/13/20 2235 99 %     Weight 03/13/20 2236 93.7 kg (206 lb 9.1 oz)     Height 03/13/20 2236 1.549 m (5\' 1" )     Head Circumference --      Peak Flow --      Pain Score 03/13/20 2236 0     Pain Loc --      Pain Edu? --      Excl. in GC? --     Constitutional: Alert and oriented.  Eyes: Conjunctivae are normal.  Head: Atraumatic. Nose: No congestion/rhinnorhea. Mouth/Throat: Patient is wearing a mask. Neck: No stridor.  No meningeal signs.   Cardiovascular: Normal rate, regular rhythm. Good peripheral circulation. Grossly normal heart sounds. Respiratory: Normal respiratory effort.  No retractions. Gastrointestinal: Soft and nontender. No distention.  Genitourinary: ED chaperone present for exam.  I performed an visual examination and see what appears to be wound dehiscence of left vaginal wall laceration previously repaired with sutures.  The sutures are still in place but there is evidence of wound dehiscence and there is discharge that is difficult to identify whether it is coming from the wound or from the vagina but it appears to  be lochia but possibly also acute infection.  Patient had difficulty tolerating the exam even though a speculum was not utilized. Musculoskeletal: No lower extremity tenderness nor edema. No gross deformities of extremities. Neurologic:  Normal speech and language. No gross focal neurologic deficits are appreciated.  Skin:  Skin is warm, dry and intact. Psychiatric: Mood and affect are normal. Speech and behavior are normal.  ____________________________________________   LABS (all labs ordered are listed, but only abnormal results are displayed)  Labs Reviewed  CBC WITH DIFFERENTIAL/PLATELET - Abnormal; Notable for the following components:      Result Value   RBC 2.86 (*)    Hemoglobin 7.9 (*)    HCT 23.8 (*)    RDW 23.7 (*)    nRBC 0.8 (*)    Abs Immature Granulocytes 0.09 (*)    All other components within normal limits  BASIC METABOLIC PANEL - Abnormal;  Notable for the following components:   Potassium 3.3 (*)    Glucose, Bld 102 (*)    Calcium 7.8 (*)    All other components within normal limits  URINALYSIS, COMPLETE (UACMP) WITH MICROSCOPIC - Abnormal; Notable for the following components:   Color, Urine YELLOW (*)    APPearance CLOUDY (*)    Hgb urine dipstick LARGE (*)    Protein, ur 30 (*)    Leukocytes,Ua LARGE (*)    RBC / HPF >50 (*)    WBC, UA >50 (*)    All other components within normal limits  SARS CORONAVIRUS 2 BY RT PCR (HOSPITAL ORDER, [facility])  AEROBIC/ANAEROBIC CULTURE (SURGICAL/DEEP WOUND)  CBC   ____________________________________________  EKG  None - EKG not ordered by ED physician ____________________________________________  RADIOLOGY Ursula Alert, personally viewed and evaluated these images (plain radiographs) as part of my medical decision making, as well as reviewing the written report by the radiologist.  ED MD interpretation: No acute abnormality identified on CT abdomen/pelvis.  Official radiology  report(s): CT ABDOMEN PELVIS W CONTRAST  Result Date: 03/14/2020 CLINICAL DATA:  Four days postpartum. Lower abdominal pain with discharge. EXAM: CT ABDOMEN AND PELVIS WITH CONTRAST TECHNIQUE: Multidetector CT imaging of the abdomen and pelvis was performed using the standard protocol following bolus administration of intravenous contrast. CONTRAST:  114mL OMNIPAQUE IOHEXOL 300 MG/ML  SOLN COMPARISON:  None. FINDINGS: LOWER CHEST: Normal. HEPATOBILIARY: Normal hepatic contours. No intra- or extrahepatic biliary dilatation. The gallbladder is normal. PANCREAS: Normal pancreas. No ductal dilatation or peripancreatic fluid collection. SPLEEN: Normal. ADRENALS/URINARY TRACT: The adrenal glands are normal. No hydronephrosis, nephroureterolithiasis or solid renal mass. The urinary bladder is normal for degree of distention STOMACH/BOWEL: There is no hiatal hernia. Normal duodenal course and caliber. No small bowel dilatation or inflammation. No focal colonic abnormality. Normal appendix. VASCULAR/LYMPHATIC: Normal course and caliber of the major abdominal vessels. No abdominal or pelvic lymphadenopathy. REPRODUCTIVE: Postpartum uterus. There is no evidence of active hemorrhage of the vagina. MUSCULOSKELETAL. No bony spinal canal stenosis or focal osseous abnormality. OTHER: None. IMPRESSION: 1. No acute abnormality of the abdomen or pelvis. 2. Postpartum uterus without evidence of active vaginal hemorrhage. Electronically Signed   By: Ulyses Jarred M.D.   On: 03/14/2020 03:00    ____________________________________________   PROCEDURES   Procedure(s) performed (including Critical Care):  Procedures   ____________________________________________   INITIAL IMPRESSION / MDM / [place] / ED COURSE  As part of my medical decision making, I reviewed the following data within the [facility] notes reviewed and incorporated, Labs reviewed , Old chart reviewed, A consult was  requested and obtained from this/these consultant(s) OB/GYN and Notes from prior ED visits   Differential diagnosis includes, but is not limited to, wound infection, wound dehiscence, endometritis, sepsis, retained products of conception.  The patient's vital signs are stable, she is afebrile and not tachycardic.  Her blood pressure is stable.  However she is in significant mount of pain.  Lab work is notable for a drop in her hemoglobin by several points postpartum which could be within normal limits but could also indicate an acute issue.  Basic metabolic panel is essentially normal.  The vaginal wound appears to have dehisced and the patient is in significant pain.  I will contact [facility] clinic OB/GYN and ask for their assistance and for them to evaluate the patient in the emergency department to determine if surgical revision of  the wound is indicated.     Clinical Course as of Mar 14 336  Sun Mar 14, 2020  0036 Paged Margaretmary EddyAnna Mackie (CNM with [facility]) to discuss case.   [CF]  [phone] Discussed case by phone with Ms. Tami LinMackie.  She is coming to the ED to evaluate the patient in person.   [CF]  [phone]  (Note that documentation was delayed due to multiple ED patients requiring immediate care.)  Ms. Tami LinMackie evaluated the patient in the emergency department and consulted with Dr. Dalbert GarnetBeasley by phone.  We also discussed the case in person together in the ED.  Their recommendation is to admit the patient for further management and possibly to go to the OR tomorrow.  They also recommended empiric treatment for possible endometritis including clindamycin 900 mg IV, gentamicin 5 mg/kg, and ampicillin 2 g IV.  The patient will remain n.p.o. and the patient agrees with the plan.  She is stable for admission.   [CF]    Clinical Course User Index [CF] Loleta RoseForbach, Regginald Pask, MD     ____________________________________________  FINAL CLINICAL IMPRESSION(S) / ED DIAGNOSES  Final diagnoses:  Post-operative pain    Wound dehiscence  Postoperative infection, unspecified type, initial encounter  Anemia, unspecified type     MEDICATIONS GIVEN DURING THIS VISIT:  Medications  gentamicin (GARAMYCIN) 470 mg in dextrose 5 % 50 mL IVPB (has no administration in time range)  ampicillin (OMNIPEN) 2 g in sodium chloride 0.9 % 100 mL IVPB (2 g Intravenous New Bag/Given 03/14/20 0337)  benzocaine-Menthol (DERMOPLAST) 20-0.5 % topical spray 1 application (has no administration in time range)  witch hazel-glycerin (TUCKS) pad 1 application (has no administration in time range)  sodium chloride flush (NS) 0.9 % injection 3 mL (3 mLs Intravenous Not Given 03/14/20 0321)  sodium chloride flush (NS) 0.9 % injection 3 mL (has no administration in time range)  0.9 %  sodium chloride infusion (has no administration in time range)  ampicillin (OMNIPEN) 2 g in sodium chloride 0.9 % 100 mL IVPB (has no administration in time range)  clindamycin (CLEOCIN) IVPB 900 mg (has no administration in time range)  gentamicin (GARAMYCIN) 470 mg in dextrose 5 % 100 mL IVPB (has no administration in time range)  morphine 4 MG/ML injection 4 mg (has no administration in time range)  acetaminophen (TYLENOL) tablet 1,000 mg (has no administration in time range)  ibuprofen (ADVIL) tablet 600 mg (has no administration in time range)  morphine 4 MG/ML injection 4 mg (4 mg Intravenous Given 03/14/20 0041)  ondansetron (ZOFRAN) injection 4 mg (4 mg Intravenous Given 03/14/20 0041)  clindamycin (CLEOCIN) IVPB 900 mg (0 mg Intravenous Stopped 03/14/20 0324)  iohexol (OMNIPAQUE) 300 MG/ML solution 100 mL (100 mLs Intravenous Contrast Given 03/14/20 0228)     ED Discharge Orders    None      *Please note:  Vernie Ammonsna M Sosa-Jimenez was evaluated in Emergency Department on 03/14/2020 for the symptoms described in the history of present illness. She was evaluated in the context of the global COVID-19 pandemic, which necessitated consideration that the  patient might be at risk for infection with the SARS-CoV-2 virus that causes COVID-19. Institutional protocols and algorithms that pertain to the evaluation of patients at risk for COVID-19 are in a state of rapid change based on information released by regulatory bodies including the CDC and federal and state organizations. These policies and algorithms were followed during the patient's care in the ED.  Some ED evaluations and interventions  may be delayed as a result of limited staffing during the pandemic.*  Note:  This document was prepared using Dragon voice recognition software and may include unintentional dictation errors.   Loleta Rose, MD 03/14/20 [id number]

## 2020-03-14 NOTE — Progress Notes (Signed)
Preprocedure note:  Pt presented overnight with sudden onset of 9/10 vaginal and uterine pain, L>R, and copious vaginal discharge. S/p NSVD on 03/09/20 with extensive vaginal lacerations repaired in the OR, uncomplicated repair.  She is afebrile with a normal WBC, but vaginal odor and copious foul-smelling discharge, along with pain to all uterine touch with bimanual, prompted empiric treatment for endometritis, started last night. Vaginal cultures are pending, gram stain positive for mod gram neg rods and gram pos cocci. CT neg for uterine or pelvic abscess, no evidence of rupture or gas in the parametrial or vaginal tissue.   On my exam, she is alert and oriented and not in acute distress. She does have significant pain at the labia and guarding limiting further internal exam. Her discharge is impressive in volume.  Of note, she denies limiting voids, but her nurse notes she hasn't emptied her bladder for the last 10 hours. She doesn't feel she has to void.  For better visualization and characterization of the field, we have consented for EUA in the OR, with other associated procedures as necessary. R/B/A discusses. All questions answered.

## 2020-03-14 NOTE — ED Notes (Signed)
ED Provider at bedside. 

## 2020-03-14 NOTE — Progress Notes (Signed)
Pt leaving unit with transport to OR.

## 2020-03-14 NOTE — Progress Notes (Signed)
Obstetric and Gynecology  Hospital Day # 1  Subjective   Patient stable, continues to have perineal and vaginal pain.  Denies abdominal pain but reports significant pain with palpation of fundus.  Tolerating PO intake, tolerating pain with IVPM, ambulating without difficulty, voiding spontaneously.   Vaginal bleeding is small to moderate.  Vaginal odor present with purulent discharge.   Denies CP, SOB, F/C, N/V/D, or leg pain.  Objective  Objective:   Vitals:   03/14/20 0432 03/14/20 0509 03/14/20 0559 03/14/20 0822  BP: 97/73 118/69 (!) 107/55 (!) 108/58  Pulse: 87 73 70 70  Resp: 20 20 18 20   Temp: 98.3 F (36.8 C) 98.5 F (36.9 C) 98.3 F (36.8 C) 98.6 F (37 C)  TempSrc: Oral Oral  Oral  SpO2: 99% 100% 98% 98%  Weight:      Height:       Temp:  [98.3 F (36.8 C)-98.7 F (37.1 C)] 98.6 F (37 C) (06/13 0822) Pulse Rate:  [65-107] 70 (06/13 0822) Resp:  [18-20] 20 (06/13 0822) BP: (97-124)/(55-73) 108/58 (06/13 0822) SpO2:  [98 %-100 %] 98 % (06/13 0822) Weight:  [93.7 kg] 93.7 kg (06/12 2236) No intake/output data recorded. No intake/output data recorded. No intake or output data in the 24 hours ending 03/14/20 1125   Current Vital Signs 24h Vital Sign Ranges  T 98.6 F (37 C) Temp  Avg: 98.5 F (36.9 C)  Min: 98.3 F (36.8 C)  Max: 98.7 F (37.1 C)  BP (!) 108/58 BP  Min: 97/73  Max: 124/70  HR 70 Pulse  Avg: 81.1  Min: 65  Max: 107  RR 20 Resp  Avg: 19.3  Min: 18  Max: 20  SaO2 98 % Room Air SpO2  Avg: 99.3 %  Min: 98 %  Max: 100 %           24 Hour I/O Current Shift I/O  Time Ins Outs No intake/output data recorded. No intake/output data recorded.   Physical Exam: Physical Exam Constitutional:      Appearance: Normal appearance.  Genitourinary:     Vaginal discharge, tenderness and bleeding present.     Uterus is tender.  Cardiovascular:     Rate and Rhythm: Normal rate and regular rhythm.  Pulmonary:     Effort: Pulmonary effort is normal.      Breath sounds: Normal breath sounds.  Abdominal:     Tenderness: There is abdominal tenderness (tender to light palpation over fundus ) in the periumbilical area.  Musculoskeletal:     Cervical back: Normal range of motion.  Neurological:     Mental Status: She is alert and oriented to person, place, and time.  Skin:    General: Skin is warm and dry.  Psychiatric:        Mood and Affect: Mood normal.     Labs: Results for orders placed or performed during the hospital encounter of 03/13/20 (from the past 24 hour(s))  CBC with Differential     Status: Abnormal   Collection Time: 03/13/20 10:40 PM  Result Value Ref Range   WBC 9.2 4.0 - 10.5 K/uL   RBC 2.86 (L) 3.87 - 5.11 MIL/uL   Hemoglobin 7.9 (L) 12.0 - 15.0 g/dL   HCT 05/13/20 (L) 36 - 46 %   MCV 83.2 80.0 - 100.0 fL   MCH 27.6 26.0 - 34.0 pg   MCHC 33.2 30.0 - 36.0 g/dL   RDW 56.8 (H) 12.7 - 51.7 %  Platelets 296 150 - 400 K/uL   nRBC 0.8 (H) 0.0 - 0.2 %   Neutrophils Relative % 69 %   Neutro Abs 6.4 1.7 - 7.7 K/uL   Lymphocytes Relative 19 %   Lymphs Abs 1.7 0.7 - 4.0 K/uL   Monocytes Relative 9 %   Monocytes Absolute 0.8 0 - 1 K/uL   Eosinophils Relative 2 %   Eosinophils Absolute 0.2 0 - 0 K/uL   Basophils Relative 0 %   Basophils Absolute 0.0 0 - 0 K/uL   Immature Granulocytes 1 %   Abs Immature Granulocytes 0.09 (H) 0.00 - 0.07 K/uL  Basic metabolic panel     Status: Abnormal   Collection Time: 03/13/20 10:40 PM  Result Value Ref Range   Sodium 139 135 - 145 mmol/L   Potassium 3.3 (L) 3.5 - 5.1 mmol/L   Chloride 108 98 - 111 mmol/L   CO2 23 22 - 32 mmol/L   Glucose, Bld 102 (H) 70 - 99 mg/dL   BUN 14 6 - 20 mg/dL   Creatinine, Ser 0.65 0.44 - 1.00 mg/dL   Calcium 7.8 (L) 8.9 - 10.3 mg/dL   GFR calc non Af Amer >60 >60 mL/min   GFR calc Af Amer >60 >60 mL/min   Anion gap 8 5 - 15  SARS Coronavirus 2 by RT PCR (hospital order, performed in [place] hospital lab) Nasopharyngeal Nasopharyngeal Swab      Status: None   Collection Time: 03/14/20  2:21 AM   Specimen: Nasopharyngeal Swab  Result Value Ref Range   SARS Coronavirus 2 NEGATIVE NEGATIVE  Aerobic/Anaerobic Culture (surgical/deep wound)     Status: None (Preliminary result)   Collection Time: 03/14/20  2:21 AM   Specimen: Vaginal  Result Value Ref Range   Specimen Description      VAGINA Performed at [facility], [address] [place], [place] [address]    Special Requests      NONE Performed at [facility], [address], [place], [place] [address]    Gram Stain      RARE WBC PRESENT, PREDOMINANTLY PMN MODERATE GRAM POSITIVE COCCI MODERATE GRAM NEGATIVE RODS Performed at [facility], [address], [place], [place] [address]    Culture PENDING    Report Status PENDING   Urinalysis, Complete w Microscopic     Status: Abnormal   Collection Time: 03/14/20  3:23 AM  Result Value Ref Range   Color, Urine YELLOW (A) YELLOW   APPearance CLOUDY (A) CLEAR   Specific Gravity, Urine 1.030 1.005 - 1.030   pH 7.0 5.0 - 8.0   Glucose, UA NEGATIVE NEGATIVE mg/dL   Hgb urine dipstick LARGE (A) NEGATIVE   Bilirubin Urine NEGATIVE NEGATIVE   Ketones, ur NEGATIVE NEGATIVE mg/dL   Protein, ur 30 (A) NEGATIVE mg/dL   Nitrite NEGATIVE NEGATIVE   Leukocytes,Ua LARGE (A) NEGATIVE   RBC / HPF >50 (H) 0 - 5 RBC/hpf   WBC, UA >50 (H) 0 - 5 WBC/hpf   Bacteria, UA NONE SEEN NONE SEEN   Squamous Epithelial / LPF 0-5 0 - 5   WBC Clumps PRESENT    Mucus PRESENT   CBC     Status: Abnormal   Collection Time: 03/14/20  7:18 AM  Result Value Ref Range   WBC 8.5 4.0 - 10.5 K/uL   RBC 2.91 (L) 3.87 - 5.11 MIL/uL   Hemoglobin 7.9 (L) 12.0 - 15.0 g/dL   HCT 24.8 (  L) 36 - 46 %   MCV 85.2 80.0 - 100.0 fL   MCH 27.1 26.0 - 34.0 pg   MCHC 31.9 30.0 - 36.0 g/dL   RDW 19.7 (H) 58.8 - 32.5 %   Platelets 313 150 - 400 K/uL   nRBC 0.6 (H) 0.0 - 0.2 %    Cultures: Results for orders placed or performed during the  hospital encounter of 03/13/20  SARS Coronavirus 2 by RT PCR (hospital order, performed in [facility] hospital lab) Nasopharyngeal Nasopharyngeal Swab     Status: None   Collection Time: 03/14/20  2:21 AM   Specimen: Nasopharyngeal Swab  Result Value Ref Range Status   SARS Coronavirus 2 NEGATIVE NEGATIVE Final    Comment: (NOTE) SARS-CoV-2 target nucleic acids are NOT DETECTED.  The SARS-CoV-2 RNA is generally detectable in upper and lower respiratory specimens during the acute phase of infection. The lowest concentration of SARS-CoV-2 viral copies this assay can detect is 250 copies / mL. A negative result does not preclude SARS-CoV-2 infection and should not be used as the sole basis for treatment or other patient management decisions.  A negative result may occur with improper specimen collection / handling, submission of specimen other than nasopharyngeal swab, presence of viral mutation(s) within the areas targeted by this assay, and inadequate number of viral copies (<250 copies / mL). A negative result must be combined with clinical observations, patient history, and epidemiological information.  Fact Sheet for Patients:   BoilerBrush.com.cy  Fact Sheet for Healthcare Providers: https://pope.com/  This test is not yet approved or  cleared by the [place] FDA and has been authorized for detection and/or diagnosis of SARS-CoV-2 by FDA under an Emergency Use Authorization (EUA).  This EUA will remain in effect (meaning this test can be used) for the duration of the COVID-19 declaration under Section 564(b)(1) of the Act, 21 U.S.C. section 360bbb-3(b)(1), unless the authorization is terminated or revoked sooner.  Performed at [facility], [address] [place], [place] [address]   Aerobic/Anaerobic Culture (surgical/deep wound)     Status: None (Preliminary result)   Collection Time: 03/14/20  2:21 AM    Specimen: Vaginal  Result Value Ref Range Status   Specimen Description   Final    VAGINA Performed at [facility], [address] [place], [place] [address]    Special Requests   Final    NONE Performed at [facility], [address] [place], [place] [address]    Gram Stain   Final    RARE WBC PRESENT, PREDOMINANTLY PMN MODERATE GRAM POSITIVE COCCI MODERATE GRAM NEGATIVE RODS Performed at [facility], [address] [place], [place] [address]    Culture PENDING  Incomplete   Report Status PENDING  Incomplete    Imaging: CT ABDOMEN PELVIS W CONTRAST  Result Date: 03/14/2020 CLINICAL DATA:  Four days postpartum. Lower abdominal pain with discharge. EXAM: CT ABDOMEN AND PELVIS WITH CONTRAST TECHNIQUE: Multidetector CT imaging of the abdomen and pelvis was performed using the standard protocol following bolus administration of intravenous contrast. CONTRAST:  OMNIPAQUE IOHEXOL 300 MG/ML  SOLN COMPARISON:  None. FINDINGS: LOWER CHEST: Normal. HEPATOBILIARY: Normal hepatic contours. No intra- or extrahepatic biliary dilatation. The gallbladder is normal. PANCREAS: Normal pancreas. No ductal dilatation or peripancreatic fluid collection. SPLEEN: Normal. ADRENALS/URINARY TRACT: The adrenal glands are normal. No hydronephrosis, nephroureterolithiasis or solid renal mass. The urinary bladder is normal for degree of distention STOMACH/BOWEL: There is no hiatal  hernia. Normal duodenal course and caliber. No small bowel dilatation or inflammation. No focal colonic abnormality. Normal appendix. VASCULAR/LYMPHATIC: Normal course and caliber of the major abdominal vessels. No abdominal or pelvic lymphadenopathy. REPRODUCTIVE: Postpartum uterus. There is no evidence of active hemorrhage of the vagina. MUSCULOSKELETAL. No bony spinal canal stenosis or focal osseous abnormality. OTHER: None. IMPRESSION: 1. No acute abnormality of the abdomen or pelvis. 2. Postpartum uterus  without evidence of active vaginal hemorrhage. Electronically Signed   By: Deatra Robinson M.D.   On: 03/14/2020 03:00     Assessment   18 y.o. G1P1001, Hospital day 1, Postpartum day 5, perineal and vaginal pain. Possible postpartum endometritis.     Plan   1. Continues to have severe perineal and vaginal pain, controlled with IVPM.  2. Pelvic CT reassuring for no active bleeding or abscesses.  3. CBC this morning is consistent with admission CBC.  4. Suspicion for postpartum endometritis do to uterine tenderness, perineal pain, and purulent vaginal discharge with odor.  Differential also includes dehiscence of vaginal repair, and wound infection.   5. Plan of care and assessment discussed with Dr. Dalbert Garnet.  Will assess for possible pelvic exam under anesthesia.     Margaretmary Eddy, CNM Certified Nurse Midwife [facility]

## 2020-03-14 NOTE — Op Note (Signed)
Jasmin Zimmerman PROCEDURE DATE:  03/14/2020  PREOPERATIVE DIAGNOSIS: - Breakdown of Postpartum laceration - Suspected endometritis  POSTOPERATIVE DIAGNOSIS: 4th deg laceration, necrotic tissue with wound breakdown, suspected postpartum endometritis  PROCEDURE:   -Exam under anesthesia -Revision of postpartum laceration    - Total length of repair in cm: 25cm - repair 4th deg perineal laceration - repair right and left sulcal laceration -repair right labial laceration -vaginal packing  SURGEON:  Dr. Benjaman Kindler  INDICATIONS: 19 y.o.  G1P1001 with vaginal laceration 5 days ago requiring OR repair for exam under anesthesia after SVD, presenting with wound breakdown and suspected infection, needing emergent surgical completion. The laceration repair was complex and required anesthesia, OR lighting and instruments to complete.   Risks of surgery were discussed with the patient including but not limited to: bleeding which may require transfusion; infection which may require antibiotics; injury to uterus or surrounding organs; need for additional procedures including laparotomy or laparoscopy; possibility of intrauterine scarring which may impair future fertility; and other postoperative/anesthesia complications. Written informed consent was obtained.  FINDINGS: complete transection of external anal spincter, IAS torn through down to the rectal mucosa. Vicryl sutures from prior repair mostly in place, but the tissue they were holding had become necrotic and pulled away in the midline, leaving an unsupported bridge of vicryl across the vagina, proximal to the hymenal ring, which was also separated and necrotic. The vaginal mucosa did not start for approx 5cm proximal to the introitus; these 5 cm were filled with devascularized tissue and suture, and came apart with light touch.  She did not have visible labia minora or a hymenal rement at the end of this case. She may need urogyn reconstruction  in the future, but her sphincters and bulbocavernosus muscles are reapproximated without tension at the close of this case.  I was personally at the bedside for 3 hours, providing critical care services, for this complex repair.  ANESTHESIA:  General INTRAVENOUS FLUIDS:  700 ESTIMATED BLOOD LOSS: 250 Urine output: 288ml  SPECIMENS: vaginal tissue to aerobic/anaerobic culture, and to pathology for evaluation COMPLICATIONS:  None immediate.  PROCEDURE DETAILS:  The patient was taken to the operating room where general anesthesia was administered and was found to be adequate.  After an adequate timeout was performed, she was placed in the dorsal lithotomy position and then prepped and draped in the sterile manner.     A formal timeout was performed with all members present and in agreement. A gentle but comprehensive vaginal exam was performed under anesthesia.   Exam revealed a full-thickness fourth degree perineal laceration with very little healthy vaginal tissue. A small EEA sizer was placed in the rectum to assist with tissue repair. Adequate anesthesia was assured prior to the start of the procedure. A multi-layer closure was planned.   The sutures were cut and removed, and starting at the deepest part of the defect, the rectal mucosa was examined. It was found to be intact in a single layer. The internal anal sphincter was identified just above the anal mucosa, separated in the midline. This layer was re-approximated in a continuous layer with 3-0 monocryl to the external sphincter. Debridement back to healthy vascularized tissue in this area allowed for new blood flow.  A second and third layer of moncryl was used to build up the posterior vaginal wall here, and then the external sphincter was identified, separated widely in the midline.  The external sphincter was grasped with two long Allis clamps and brought  to the midline. It was closed in an end-to-end fashion with 2-0 monocryl in  multiple interrupted stiches. It came together without excess tissues tension, once the surround submucosal tissue had been reapproximated.  The rectovaginal septum and perineal body were brought together in running layers, adding in a crown stitch to bring the deep edges of the bulbocavernosus together in the midline.   However, there was no vaginal mucosa from the perineal skin edge at the posterior fourchette to well past the hymenal ring, for about 5cm.  The hymenal remnant was absent from about 4 o'clock to 9 o'clock as well, except for an edamatous fragment at 7 o'clock which was left alone.  In the absence of vaginal skin to reapproximate, the decision was made to restore what normal anatomy was visible and I completed that.   A bivalve speculum was then carefully placed, the cervix grasped and an endometrial biopsy pipelle used to collect what appeared to be bloody and purulent fluid. This was sent for culture.  The bladder was backfilled with blue water to and no blue noted on the vaginal packing.   The rectum was palpated and no sutures were felt along the rectal mucosa. A tablet of misoprostol was evacuated, however, presumably from her hemorrhage 5 days earlier. Minimal stool in the rectal vault.  All instruments were removed from the patient's vagina.  Sponge and instrument counts were correct times two.  The patient tolerated the procedure well and was taken to the recovery area awake, extubated and in stable condition.  Because her starting hgb was so low, 1u of pRBCs was given during the procedure.   We had started her on broad spectrum abx last night on presentation due to her significant fundal tenderness. The embx pipelle contents did not reassure me that she doesn't have endometritis, so I will continue broad spectrum gent/clinda for now. We will stop the ampicillin as she was GBS neg. We will continue to monitor for signs of GAS endometritis, which could have devastating  sequela. We would add vanco in addition if she does not improve clinically, or begins to spike a fever.  Because of the continued oozing, Surgiflo and Premarin were placed along the debrided mucosa, and her vagina was packed. We left the foley cath in and plan to remove both tomorrow. I will recommend continuing vaginal estrogen for 4-6 weeks after this procedure. I will also recommend pelvic floor physical therapy 6-8 weeks from now as well.  I will order pain meds and a bowel regimen.   The patient tolerated the procedure well and is stable in the PACU.

## 2020-03-14 NOTE — ED Notes (Signed)
CNM @ bedside

## 2020-03-14 NOTE — H&P (Signed)
History and Physical   SERVICE: Obstetrics and Gynecology   Patient Name: Jasmin Zimmerman Patient MRN:   [id number]  CC: Presented to the ED with complaints of vaginal and pelvic pain   HPI: Jasmin Zimmerman is a 19 y.o. G1P1001, postpartum day 5, presented to the ED via EMS with severe vaginal and pelvic pain.  Reports that pain started around 1900 on 03/13/2020.  She was sitting on the couch when the pain started.  She denies any particular activity that triggered the event.  She had a vaginal birth on 03/09/2020 that was complicated by a postpartum hemorrhage and vaginal lacerations that required repair in the OR.  States that her pain was well controlled when she discharged home and had minimal perineal pain until yesterday.  Denies any increase or change in vaginal bleeding or vaginal odor.    Review of Systems: positives in bold Review of Systems  Constitutional: Negative for chills and fever.  Respiratory: Negative for cough and shortness of breath.   Cardiovascular: Negative for chest pain and palpitations.  Gastrointestinal: Positive for abdominal pain. Negative for nausea and vomiting.  Genitourinary: Negative for dysuria.       Reports severe pelvic and perineal pain   Neurological: Negative for dizziness and headaches.  Psychiatric/Behavioral: The patient is not nervous/anxious.    Past Obstetrical History: OB History    Gravida  1   Para  1   Term  1   Preterm  0   AB  0   Living  1     SAB  0   TAB  0   Ectopic  0   Multiple  0   Live Births  1           Past Gynecologic History:  LMP 06/15/2020 - Postpartum day 5  Past Medical History: Past Medical History:  Diagnosis Date  . Anemia affecting pregnancy in third trimester 01/22/2020  . Biliary colic 08/27/2019   @ ED-small gallstones seem    Past Surgical History:   Past Surgical History:  Procedure Laterality Date  . left shoulder surgery Left    Left shoulder surgery in middle  school, unsure date and reason  . REPAIR VAGINAL CUFF N/A 03/09/2020   Procedure: REPAIR VAGINAL CUFF;  Surgeon: Schermerhorn, Ihor Austin, MD;  Location: [facility] ORS;  Service: Gynecology;  Laterality: N/A;  . WISDOM TOOTH EXTRACTION Bilateral 2018    Family History:  family history includes Diabetes in her maternal grandfather, maternal grandmother, and maternal uncle; Kidney disease in her maternal grandfather; Vision loss in her maternal grandmother.  Social History:   reports that she has never smoked. She has never used smokeless tobacco. She reports that she does not drink alcohol and does not use drugs.  Home Medications:  Medications reconciled in EPIC  No current facility-administered medications on file prior to encounter.   Current Outpatient Medications on File Prior to Encounter  Medication Sig Dispense Refill  . acetaminophen (TYLENOL) 325 MG tablet Take 2 tablets (650 mg total) by mouth every 4 (four) hours as needed (for pain scale < 4).    . benzocaine-Menthol (DERMOPLAST) 20-0.5 % AERO Apply 1 application topically as needed for irritation (perineal discomfort).    . calcium carbonate (TUMS - DOSED IN MG ELEMENTAL CALCIUM) 500 MG chewable tablet Chew 1 tablet by mouth daily.    Marland Kitchen ibuprofen (ADVIL) 600 MG tablet Take 1 tablet (600 mg total) by mouth every 6 (six) hours. 90 tablet 0  .  Prenatal Vit-Fe Fumarate-FA (MULTIVITAMIN-PRENATAL) 27-0.8 MG TABS tablet Take 1 tablet by mouth daily at 12 noon.    . senna-docusate (SENOKOT-S) 8.6-50 MG tablet Take 2 tablets by mouth daily.    . ferrous sulfate 324 (65 Fe) MG TBEC Take 1 tablet (325 mg total) by mouth in the morning, at noon, and at bedtime. 100 tablet 0  . witch hazel-glycerin (TUCKS) pad Apply 1 application topically 4 (four) times daily. 40 each 12    Allergies:  No Known Allergies  Physical Exam:  Temp:  [98.3 F (36.8 C)-98.7 F (37.1 C)] 98.7 F (37.1 C) (06/13 0120) Pulse Rate:  [71-107] 71 (06/13 0200) Resp:   [18] 18 (06/12 2235) BP: (111-124)/(58-70) 111/66 (06/13 0200) SpO2:  [98 %-100 %] 100 % (06/13 0200) Weight:  [93.7 kg] 93.7 kg (06/12 2236)  Physical Exam Constitutional:      Appearance: Normal appearance.  Genitourinary:     Vaginal discharge, tenderness and bleeding present.     Vaginal exam comments: Purulent discharge with odor present .     Uterus is enlarged (postpartum day 5) and tender (tender to palpation ).  HENT:     Mouth/Throat:     Mouth: Mucous membranes are moist.  Cardiovascular:     Rate and Rhythm: Normal rate.  Pulmonary:     Effort: Pulmonary effort is normal.  Abdominal:     Palpations: Abdomen is soft.     Tenderness: There is abdominal tenderness (fundus tender with palpation) in the periumbilical area and suprapubic area.  Musculoskeletal:     Cervical back: Normal range of motion.  Neurological:     Mental Status: She is alert and oriented to person, place, and time.  Skin:    General: Skin is warm.  Psychiatric:        Mood and Affect: Mood normal.     Labs/Studies:   CBC and Coags:  Lab Results  Component Value Date   WBC 9.2 03/13/2020   NEUTOPHILPCT 69 03/13/2020   EOSPCT 2 03/13/2020   BASOPCT 0 03/13/2020   LYMPHOPCT 19 03/13/2020   HGB 7.9 (L) 03/13/2020   HCT 23.8 (L) 03/13/2020   MCV 83.2 03/13/2020   PLT 296 03/13/2020   CMP:  Lab Results  Component Value Date   NA 139 03/13/2020   K 3.3 (L) 03/13/2020   CL 108 03/13/2020   CO2 23 03/13/2020   BUN 14 03/13/2020   CREATININE 0.65 03/13/2020   CREATININE 0.49 09/03/2019   CREATININE 0.51 08/28/2019   PROT 7.8 09/03/2019   BILITOT 0.5 09/03/2019   ALT 30 09/03/2019   AST 30 09/03/2019   ALKPHOS 66 09/03/2019    Other Imaging: CT ABDOMEN PELVIS W CONTRAST  Result Date: 03/14/2020 CLINICAL DATA:  Four days postpartum. Lower abdominal pain with discharge. EXAM: CT ABDOMEN AND PELVIS WITH CONTRAST TECHNIQUE: Multidetector CT imaging of the abdomen and pelvis was  performed using the standard protocol following bolus administration of intravenous contrast. CONTRAST:  153mL OMNIPAQUE IOHEXOL 300 MG/ML  SOLN COMPARISON:  None. FINDINGS: LOWER CHEST: Normal. HEPATOBILIARY: Normal hepatic contours. No intra- or extrahepatic biliary dilatation. The gallbladder is normal. PANCREAS: Normal pancreas. No ductal dilatation or peripancreatic fluid collection. SPLEEN: Normal. ADRENALS/URINARY TRACT: The adrenal glands are normal. No hydronephrosis, nephroureterolithiasis or solid renal mass. The urinary bladder is normal for degree of distention STOMACH/BOWEL: There is no hiatal hernia. Normal duodenal course and caliber. No small bowel dilatation or inflammation. No focal colonic abnormality. Normal appendix. VASCULAR/LYMPHATIC: Normal  course and caliber of the major abdominal vessels. No abdominal or pelvic lymphadenopathy. REPRODUCTIVE: Postpartum uterus. There is no evidence of active hemorrhage of the vagina. MUSCULOSKELETAL. No bony spinal canal stenosis or focal osseous abnormality. OTHER: None. IMPRESSION: 1. No acute abnormality of the abdomen or pelvis. 2. Postpartum uterus without evidence of active vaginal hemorrhage. Electronically Signed   By: Deatra Robinson M.D.   On: 03/14/2020 03:00     Assessment / Plan:   Yarima M SOSA-JIMENEZ is a 19 y.o. G1P1001 who presents severe perineal and pelvic pain, postpartum day 5  1. Assessment and plan of care discussed with Dr. Dalbert Garnet 2. Abd and pelvic CT with contrast ordered 3. Aerobic/anerobic vaginal cultures collected - Valine reported significant pain with swab culture. 4. Unable to adequately assess vaginal lacerations and status of repair d/t significant pain reported by [facility].  Vital signs remain stable and vaginal bleeding is small, though frequent trickle. Purulent vaginal discharge with odor was noted.  Will consider exam under anesthesia if unable to adequately perform pelvic exam.   5. Antibiotics ordered for possible  postpartum endometritis.  Fundus is significantly tender to light palpation.  Other differentials include wound infection and dehiscence of vaginal repair.   6. CBC ordered for this morning.   7. NPO  8. PO and IV pain management options ordered.   9. Will place in observation in M/B.     ----- Margaretmary Eddy, CNM Midwife [facility], Department of OB/GYN [facility]

## 2020-03-14 NOTE — Progress Notes (Signed)
Consent signed. Verbal order by PACU nurse to not complete CHG wipes due to limited time. Pre-procedure checklist complete. Pt transported to OR at this time.   Inge Rise, RN

## 2020-03-15 ENCOUNTER — Encounter: Payer: Self-pay | Admitting: Obstetrics and Gynecology

## 2020-03-15 LAB — BPAM RBC
Blood Product Expiration Date: 202107142359
Blood Product Expiration Date: 202107152359
ISSUE DATE / TIME: 202106131459
Unit Type and Rh: 5100
Unit Type and Rh: 5100

## 2020-03-15 LAB — COMPREHENSIVE METABOLIC PANEL
ALT: 25 U/L (ref 0–44)
ALT: 23 U/L (ref 0–44)
AST: 30 U/L (ref 15–41)
AST: 31 U/L (ref 15–41)
Albumin: 2.2 g/dL — ABNORMAL LOW (ref 3.5–5.0)
Albumin: 2.2 g/dL — ABNORMAL LOW (ref 3.5–5.0)
Alkaline Phosphatase: 79 U/L (ref 38–126)
Alkaline Phosphatase: 78 U/L (ref 38–126)
Anion gap: 8 (ref 5–15)
Anion gap: 11 (ref 5–15)
BUN: 10 mg/dL (ref 6–20)
BUN: 12 mg/dL (ref 6–20)
CO2: 24 mmol/L (ref 22–32)
CO2: 23 mmol/L (ref 22–32)
Calcium: 8.1 mg/dL — ABNORMAL LOW (ref 8.9–10.3)
Calcium: 8.2 mg/dL — ABNORMAL LOW (ref 8.9–10.3)
Chloride: 107 mmol/L (ref 98–111)
Chloride: 106 mmol/L (ref 98–111)
Creatinine, Ser: 0.64 mg/dL (ref 0.44–1.00)
Creatinine, Ser: 0.68 mg/dL (ref 0.44–1.00)
GFR calc Af Amer: >60 mL/min (ref >60)
GFR calc Af Amer: >60 mL/min (ref >60)
GFR calc non Af Amer: >60 mL/min (ref >60)
GFR calc non Af Amer: >60 mL/min (ref >60)
Glucose, Bld: 116 mg/dL — ABNORMAL HIGH (ref 70–99)
Glucose, Bld: 96 mg/dL (ref 70–99)
Potassium: 3.8 mmol/L (ref 3.5–5.1)
Potassium: 3.4 mmol/L — ABNORMAL LOW (ref 3.5–5.1)
Sodium: 139 mmol/L (ref 135–145)
Sodium: 140 mmol/L (ref 135–145)
Total Bilirubin: 0.3 mg/dL (ref 0.3–1.2)
Total Bilirubin: 0.4 mg/dL (ref 0.3–1.2)
Total Protein: 5.5 g/dL — ABNORMAL LOW (ref 6.5–8.1)
Total Protein: 5.5 g/dL — ABNORMAL LOW (ref 6.5–8.1)

## 2020-03-15 LAB — TYPE AND SCREEN
ABO/RH(D): O POS
Antibody Screen: NEGATIVE
Unit division: 0
Unit division: 0

## 2020-03-15 LAB — LACTIC ACID, PLASMA
Lactic Acid, Venous: 0.9 mmol/L (ref 0.5–1.9)
Lactic Acid, Venous: 1.7 mmol/L (ref 0.5–1.9)

## 2020-03-15 LAB — CBC
HCT: 26.1 % — ABNORMAL LOW (ref 36.0–46.0)
HCT: 26 % — ABNORMAL LOW (ref 36.0–46.0)
Hemoglobin: 8.9 g/dL — ABNORMAL LOW (ref 12.0–15.0)
Hemoglobin: 8.8 g/dL — ABNORMAL LOW (ref 12.0–15.0)
MCH: 27.6 pg (ref 26.0–34.0)
MCH: 27.5 pg (ref 26.0–34.0)
MCHC: 34.1 g/dL (ref 30.0–36.0)
MCHC: 33.8 g/dL (ref 30.0–36.0)
MCV: 81.1 fL (ref 80.0–100.0)
MCV: 81.3 fL (ref 80.0–100.0)
Platelets: 343 10*3/uL (ref 150–400)
Platelets: 347 10*3/uL (ref 150–400)
RBC: 3.22 MIL/uL — ABNORMAL LOW (ref 3.87–5.11)
RBC: 3.2 MIL/uL — ABNORMAL LOW (ref 3.87–5.11)
RDW: 22.3 % — ABNORMAL HIGH (ref 11.5–15.5)
RDW: 22.1 % — ABNORMAL HIGH (ref 11.5–15.5)
WBC: 10.9 10*3/uL — ABNORMAL HIGH (ref 4.0–10.5)
WBC: 7.5 10*3/uL (ref 4.0–10.5)
nRBC: 0.4 % — ABNORMAL HIGH (ref 0.0–0.2)
nRBC: 0.8 % — ABNORMAL HIGH (ref 0.0–0.2)

## 2020-03-15 LAB — PREPARE RBC (CROSSMATCH)

## 2020-03-15 MED ORDER — GLYCERIN (LAXATIVE) 2.1 G RE SUPP
1.0000 | RECTAL | Status: DC | PRN
  Filled 2020-03-15: qty 1

## 2020-03-15 MED ORDER — MAGNESIUM CITRATE PO SOLN
1.0000 | Freq: Once | ORAL | Status: AC
  Administered 2020-03-15: 1 via ORAL
  Filled 2020-03-15 (×2): qty 296

## 2020-03-15 MED ORDER — SENNOSIDES-DOCUSATE SODIUM 8.6-50 MG PO TABS
1.0000 | ORAL_TABLET | Freq: Every evening | ORAL | Status: DC | PRN
  Administered 2020-03-15: 1 via ORAL

## 2020-03-15 NOTE — Progress Notes (Signed)
Zofran given for c/o Nausea and Pt. Had stated relief prior to RN leaving room.

## 2020-03-15 NOTE — Progress Notes (Signed)
Dr. Esau Grew called and notified of Pt. C/o nausea. Zofran ordered.

## 2020-03-15 NOTE — Progress Notes (Addendum)
1 Day Post-Op       Procedure(s): EXAM UNDER ANESTHESIA (N/A) SUTURE REPAIR PERINEAL LACERATION Subjective: The patient is doing well.  No nausea or vomiting. Pain is adequately controlled.  Objective: Vital signs in last 24 hours: Temp:  [96 F (35.6 C)-98.6 F (37 C)] 98.5 F (36.9 C) (06/14 0430) Pulse Rate:  [52-72] 72 (06/14 0430) Resp:  [12-21] 16 (06/14 0430) BP: (94-108)/(44-68) 98/53 (06/14 0430) SpO2:  [92 %-100 %] 94 % (06/14 0430)  Intake/Output  Intake/Output Summary (Last 24 hours) at 03/15/2020 0755 Last data filed at 03/15/2020 0639 Gross per 24 hour  Intake 2496.99 ml  Output 2600 ml  Net -103.01 ml    Physical Exam:  General: Alert and oriented. CV: RRR Lungs: Clear bilaterally. GI: Soft, Nondistended. Urine: Clear, Foley in place Extremities: Nontender, no erythema, no edema. Vaginal packing removed- no bright bleeding Foley cath removed  Lab Results: Recent Labs    03/14/20 0718 03/14/20 1812 03/15/20 0447  HGB 7.9* 9.5* 8.9*  HCT 24.8* 29.3* 26.1*  WBC 8.5 13.8* 10.9*  PLT 313 319 343                 Results for orders placed or performed during the hospital encounter of 03/13/20 (from the past 24 hour(s))  Type and screen [facility]     Status: None (Preliminary result)   Collection Time: 03/14/20 12:42 PM  Result Value Ref Range   ABO/RH(D) O POS    Antibody Screen NEG    Sample Expiration 03/17/2020,2359    Unit Number [id number]    Blood Component Type RED CELLS,LR    Unit division 00    Status of Unit REL FROM [facility]    Transfusion Status OK TO TRANSFUSE    Crossmatch Result      Compatible Performed at [facility], [address], [place], [place] [address]    Unit Number [id number]    Blood Component Type RED CELLS,LR    Unit division 00    Status of Unit ISSUED    Transfusion Status OK TO TRANSFUSE    Crossmatch Result Compatible   Aerobic/Anaerobic Culture (surgical/deep wound)      Status: None (Preliminary result)   Collection Time: 03/14/20  2:34 PM   Specimen: Soft Tissue, Other  Result Value Ref Range   Specimen Description      WOUND Performed at [facility], [address], [place], [place] [address]    Special Requests      NONE Performed at [facility], [address] [place], [place] [address]    Gram Stain      ABUNDANT WBC PRESENT, PREDOMINANTLY PMN ABUNDANT GRAM POSITIVE COCCI RARE GRAM POSITIVE RODS RARE GRAM NEGATIVE RODS Performed at [facility], [address] [place], [place] [address]    Culture PENDING    Report Status PENDING   Prepare RBC (crossmatch)     Status: None   Collection Time: 03/14/20  2:45 PM  Result Value Ref Range   Order Confirmation      ORDER PROCESSED BY BLOOD BANK Performed at [facility], [address] [place], [place] [address]   Aerobic/Anaerobic Culture (surgical/deep wound)     Status: None (Preliminary result)   Collection Time: 03/14/20  3:29 PM   Specimen: Soft Tissue, Other  Result Value Ref Range   Specimen Description      WOUND Performed at [facility], [address] [place],  [place] [address]    Special Requests      NONE Performed at [facility], [place], [place] [address]    Gram Stain      ABUNDANT WBC PRESENT, PREDOMINANTLY PMN FEW GRAM POSITIVE COCCI RARE GRAM NEGATIVE RODS Performed at [facility], [place] [address], [place], [place] [address]    Culture PENDING    Report Status PENDING   CBC     Status: Abnormal   Collection Time: 03/14/20  6:12 PM  Result Value Ref Range   WBC 13.8 (H) 4.0 - 10.5 K/uL   RBC 3.44 (L) 3.87 - 5.11 MIL/uL   Hemoglobin 9.5 (L) 12.0 - 15.0 g/dL   HCT 29.3 (L) 36 - 46 %   MCV 85.2 80.0 - 100.0 fL   MCH 27.6 26.0 - 34.0 pg   MCHC 32.4 30.0 - 36.0 g/dL   RDW 22.3 (H) 11.5 - 15.5 %   Platelets 319 150 - 400 K/uL   nRBC 0.1 0.0 - 0.2 %  Comprehensive metabolic  panel     Status: Abnormal   Collection Time: 03/14/20  6:12 PM  Result Value Ref Range   Sodium 138 135 - 145 mmol/L   Potassium 4.2 3.5 - 5.1 mmol/L   Chloride 106 98 - 111 mmol/L   CO2 27 22 - 32 mmol/L   Glucose, Bld 110 (H) 70 - 99 mg/dL   BUN 10 6 - 20 mg/dL   Creatinine, Ser 0.57 0.44 - 1.00 mg/dL   Calcium 8.3 (L) 8.9 - 10.3 mg/dL   Total Protein 6.0 (L) 6.5 - 8.1 g/dL   Albumin 2.4 (L) 3.5 - 5.0 g/dL   AST 38 15 - 41 U/L   ALT 29 0 - 44 U/L   Alkaline Phosphatase 93 38 - 126 U/L   Total Bilirubin 0.5 0.3 - 1.2 mg/dL   GFR calc non Af Amer >60 >60 mL/min   GFR calc Af Amer >60 >60 mL/min   Anion gap 5 5 - 15  Lactic acid, plasma     Status: None   Collection Time: 03/14/20  6:12 PM  Result Value Ref Range   Lactic Acid, Venous 0.9 0.5 - 1.9 mmol/L  CBC     Status: Abnormal   Collection Time: 03/15/20  4:47 AM  Result Value Ref Range   WBC 10.9 (H) 4.0 - 10.5 K/uL   RBC 3.22 (L) 3.87 - 5.11 MIL/uL   Hemoglobin 8.9 (L) 12.0 - 15.0 g/dL   HCT 26.1 (L) 36 - 46 %   MCV 81.1 80.0 - 100.0 fL   MCH 27.6 26.0 - 34.0 pg   MCHC 34.1 30.0 - 36.0 g/dL   RDW 22.3 (H) 11.5 - 15.5 %   Platelets 343 150 - 400 K/uL   nRBC 0.4 (H) 0.0 - 0.2 %  Comprehensive metabolic panel     Status: Abnormal   Collection Time: 03/15/20  4:47 AM  Result Value Ref Range   Sodium 139 135 - 145 mmol/L   Potassium 3.8 3.5 - 5.1 mmol/L   Chloride 107 98 - 111 mmol/L   CO2 24 22 - 32 mmol/L   Glucose, Bld 116 (H) 70 - 99 mg/dL   BUN 10 6 - 20 mg/dL   Creatinine, Ser 0.64 0.44 - 1.00 mg/dL   Calcium 8.1 (L) 8.9 - 10.3 mg/dL   Total Protein 5.5 (L) 6.5 - 8.1 g/dL   Albumin 2.2 (L) 3.5 - 5.0  g/dL   AST 30 15 - 41 U/L   ALT 25 0 - 44 U/L   Alkaline Phosphatase 79 38 - 126 U/L   Total Bilirubin 0.3 0.3 - 1.2 mg/dL   GFR calc non Af Amer >60 >60 mL/min   GFR calc Af Amer >60 >60 mL/min   Anion gap 8 5 - 15  Lactic acid, plasma     Status: None   Collection Time: 03/15/20  4:51 AM  Result Value  Ref Range   Lactic Acid, Venous 0.9 0.5 - 1.9 mmol/L    Assessment/Plan: 1 Day Post-Op       Procedure(s): EXAM UNDER ANESTHESIA (N/A) SUTURE REPAIR PERINEAL LACERATION  - Packing removed with significant pain. Goal today to control vaginal pain, monitor for s/s of not resolving endometritis, and help resolve constipation. She has not moved her bowels for 8 days now - dilaudid and percocet PRN, scheduled celebrex and tylenol - Fundal tenderness: may be constipation related, given stable WBC and vital signs, but I still can't r/o endometritis with clear abdominal pain that gets worse with fundal massage. Continue broad spectrum gent/clinda for now. Add vanco if no resolution 24-48hrs or with moving bowels - Chronic constipation: miralax, glycerin suppository, senna - regular diet - Chronic anemia: s/p 1 u PRBC in surgery yesterday, stable today -  -Ambulate, Incentive spirometry   Christeen Douglas, MD   LOS: 1 day   Christeen Douglas 03/15/2020, 7:55 AM

## 2020-03-16 LAB — COMPREHENSIVE METABOLIC PANEL
ALT: 21 U/L (ref 0–44)
AST: 25 U/L (ref 15–41)
Albumin: 2.2 g/dL — ABNORMAL LOW (ref 3.5–5.0)
Alkaline Phosphatase: 70 U/L (ref 38–126)
Anion gap: 8 (ref 5–15)
BUN: 14 mg/dL (ref 6–20)
CO2: 26 mmol/L (ref 22–32)
Calcium: 8.3 mg/dL — ABNORMAL LOW (ref 8.9–10.3)
Chloride: 109 mmol/L (ref 98–111)
Creatinine, Ser: 0.68 mg/dL (ref 0.44–1.00)
GFR calc Af Amer: >60 mL/min (ref >60)
GFR calc non Af Amer: >60 mL/min (ref >60)
Glucose, Bld: 94 mg/dL (ref 70–99)
Potassium: 4 mmol/L (ref 3.5–5.1)
Sodium: 143 mmol/L (ref 135–145)
Total Bilirubin: 0.4 mg/dL (ref 0.3–1.2)
Total Protein: 5.4 g/dL — ABNORMAL LOW (ref 6.5–8.1)

## 2020-03-16 LAB — CBC
HCT: 25.2 % — ABNORMAL LOW (ref 36.0–46.0)
Hemoglobin: 8.5 g/dL — ABNORMAL LOW (ref 12.0–15.0)
MCH: 27.8 pg (ref 26.0–34.0)
MCHC: 33.7 g/dL (ref 30.0–36.0)
MCV: 82.4 fL (ref 80.0–100.0)
Platelets: 344 10*3/uL (ref 150–400)
RBC: 3.06 MIL/uL — ABNORMAL LOW (ref 3.87–5.11)
RDW: 22.5 % — ABNORMAL HIGH (ref 11.5–15.5)
WBC: 7.3 10*3/uL (ref 4.0–10.5)
nRBC: 1.2 % — ABNORMAL HIGH (ref 0.0–0.2)

## 2020-03-16 LAB — URINE CULTURE: Culture: 50000 — AB

## 2020-03-16 LAB — SURGICAL PATHOLOGY

## 2020-03-16 LAB — LACTIC ACID, PLASMA: Lactic Acid, Venous: 1.1 mmol/L (ref 0.5–1.9)

## 2020-03-16 MED ORDER — OXYCODONE-ACETAMINOPHEN 10-325 MG PO TABS: 1.0000 | ORAL_TABLET | ORAL | 0 refills | Status: DC | PRN

## 2020-03-16 NOTE — Discharge Summary (Signed)
2 Days Post-Op       Procedure(s): EXAM UNDER ANESTHESIA (N/A) SUTURE REPAIR PERINEAL LACERATION Subjective: The patient is doing well.  No nausea or vomiting. Pain is adequately controlled. Tolerating po. S/p 4 bowel movements with mag citrate and colace  Objective: Vital signs in last 24 hours: Temp:  [98.3 F (36.8 C)-99.1 F (37.3 C)] 98.9 F (37.2 C) (06/15 0735) Pulse Rate:  [71-80] 80 (06/15 0735) Resp:  [16-20] 20 (06/15 0735) BP: (85-141)/(43-103) 110/50 (06/15 0735) SpO2:  [94 %-97 %] 95 % (06/15 0326)  Intake/Output  Intake/Output Summary (Last 24 hours) at 03/16/2020 1344 Last data filed at 03/16/2020 1100 Gross per 24 hour  Intake 735.91 ml  Output 1700 ml  Net -964.09 ml    Physical Exam:  General: Alert and oriented. CV: RRR Lungs: Clear bilaterally. GI: Soft, Nondistended, fundus firm and non tender Incisions: Clean and dry. Vaginal laceration: reapproximated, minimal discharge, no odor or purulence  Extremities: Nontender, no erythema, no edema.  Lab Results: Recent Labs    03/15/20 0447 03/15/20 1708 03/16/20 0624  HGB 8.9* 8.8* 8.5*  HCT 26.1* 26.0* 25.2*  WBC 10.9* 7.5 7.3  PLT 343 347 344                 Results for orders placed or performed during the hospital encounter of 03/13/20 (from the past 24 hour(s))  CBC     Status: Abnormal   Collection Time: 03/15/20  5:08 PM  Result Value Ref Range   WBC 7.5 4.0 - 10.5 K/uL   RBC 3.20 (L) 3.87 - 5.11 MIL/uL   Hemoglobin 8.8 (L) 12.0 - 15.0 g/dL   HCT 20.2 (L) 36 - 46 %   MCV 81.3 80.0 - 100.0 fL   MCH 27.5 26.0 - 34.0 pg   MCHC 33.8 30.0 - 36.0 g/dL   RDW 54.2 (H) 70.6 - 23.7 %   Platelets 347 150 - 400 K/uL   nRBC 0.8 (H) 0.0 - 0.2 %  Comprehensive metabolic panel     Status: Abnormal   Collection Time: 03/15/20  5:08 PM  Result Value Ref Range   Sodium 140 135 - 145 mmol/L   Potassium 3.4 (L) 3.5 - 5.1 mmol/L   Chloride 106 98 - 111 mmol/L   CO2 23 22 - 32 mmol/L   Glucose, Bld  96 70 - 99 mg/dL   BUN 12 6 - 20 mg/dL   Creatinine, Ser 6.28 0.44 - 1.00 mg/dL   Calcium 8.2 (L) 8.9 - 10.3 mg/dL   Total Protein 5.5 (L) 6.5 - 8.1 g/dL   Albumin 2.2 (L) 3.5 - 5.0 g/dL   AST 31 15 - 41 U/L   ALT 23 0 - 44 U/L   Alkaline Phosphatase 78 38 - 126 U/L   Total Bilirubin 0.4 0.3 - 1.2 mg/dL   GFR calc non Af Amer >60 >60 mL/min   GFR calc Af Amer >60 >60 mL/min   Anion gap 11 5 - 15  Lactic acid, plasma     Status: None   Collection Time: 03/15/20  5:08 PM  Result Value Ref Range   Lactic Acid, Venous 1.7 0.5 - 1.9 mmol/L  CBC     Status: Abnormal   Collection Time: 03/16/20  6:24 AM  Result Value Ref Range   WBC 7.3 4.0 - 10.5 K/uL   RBC 3.06 (L) 3.87 - 5.11 MIL/uL   Hemoglobin 8.5 (L) 12.0 - 15.0 g/dL   HCT 31.5 (L)  36 - 46 %   MCV 82.4 80.0 - 100.0 fL   MCH 27.8 26.0 - 34.0 pg   MCHC 33.7 30.0 - 36.0 g/dL   RDW 22.5 (H) 11.5 - 15.5 %   Platelets 344 150 - 400 K/uL   nRBC 1.2 (H) 0.0 - 0.2 %  Comprehensive metabolic panel     Status: Abnormal   Collection Time: 03/16/20  6:24 AM  Result Value Ref Range   Sodium 143 135 - 145 mmol/L   Potassium 4.0 3.5 - 5.1 mmol/L   Chloride 109 98 - 111 mmol/L   CO2 26 22 - 32 mmol/L   Glucose, Bld 94 70 - 99 mg/dL   BUN 14 6 - 20 mg/dL   Creatinine, Ser 0.68 0.44 - 1.00 mg/dL   Calcium 8.3 (L) 8.9 - 10.3 mg/dL   Total Protein 5.4 (L) 6.5 - 8.1 g/dL   Albumin 2.2 (L) 3.5 - 5.0 g/dL   AST 25 15 - 41 U/L   ALT 21 0 - 44 U/L   Alkaline Phosphatase 70 38 - 126 U/L   Total Bilirubin 0.4 0.3 - 1.2 mg/dL   GFR calc non Af Amer >60 >60 mL/min   GFR calc Af Amer >60 >60 mL/min   Anion gap 8 5 - 15  Lactic acid, plasma     Status: None   Collection Time: 03/16/20  6:24 AM  Result Value Ref Range   Lactic Acid, Venous 1.1 0.5 - 1.9 mmol/L    Assessment/Plan: 2 Days Post-Op       Procedure(s): EXAM UNDER ANESTHESIA (N/A) SUTURE REPAIR PERINEAL LACERATION  -Ambulate, Incentive spirometry -Infection: s/p gent/clinda.  Monitor at home - vaginal laceration/tissue dehiscence: vaginal estrogen nightly for 4-6 weeks. F/u in office in 48hrs. Conservative home measures discussed. Sitz baths, keep stools loose. - consider PFPT in 6 weeks - anemia: continue po iron in 1 month. Exam benign. No evidence of intraperitoneal bleeding -constipation: miralax at home daily for 4 weeks -continue to oral pain medication -Discharge home today anticipated    Benjaman Kindler, MD   LOS: 2 days   Benjaman Kindler 03/16/2020, 1:44 PM

## 2020-03-16 NOTE — Discharge Instructions (Signed)
Care of a Labial Tear  A labial tear is a cut (laceration) in the tissue around opening of the vagina. It can happen during childbirth, and can also happen for other reasons, such as trauma.    What are the risks? Depending on the type of perineal tear you have, you may be at risk for the following:  Bleeding.  Developing a collection of blood in the tear area (hematoma).  Pain. This may include pain with urination or bowel movements.  Infection at the site of the tear.  Fever.  Trouble controlling your bowels (fecal incontinence).    How to care for a perineal tear  The first day, put ice on the area of the tear. ? Put ice in a plastic bag. ? Place a towel between your skin and the bag. ? Leave the ice on for 20 minutes, 2-3 times a day.  Bathe using a warm sitz bath as directed by your health care provider. This can speed up healing. Sitz baths can be performed in your bathtub or using a sitz bath kit that fits over your toilet. ? Place 3-4 in. (7.6-10 cm) of warm water in your bathtub or fill the sitz bath over-the-toilet container with warm water. Make sure the water is not too hot by placing a drop on your wrist. ? Sit in the warm water for 20-30 minutes. ? After bathing, pat your perineum dry with a clean towel. Do not scrub the perineum as this could cause pain, irritation, or open any stitches you may have. ? Keep the over-the-toilet sitz bath container clean by rinsing it thoroughly after each use. Ask for help in keeping the bathtub clean with diluted bleach and water (2 Tbsp [30 mL] of bleach to  gal [1.9 L] of water). ? Repeat the sitz bath as often as you would like to relieve perineal pain, itching, or discomfort.  Apply a numbing spray to the perineal tear site as directed by your health care provider. This may help with discomfort.  Wash your hands before and after applying medicine to the area.  Put about 3 witch hazel-containing hemorrhoid treatment pads on  top of your sanitary pad. The witch hazel in the hemorrhoid pads helps with discomfort and swelling.  Get a squeeze bottle to squeeze warm water on your perineum when urinating, spraying the area from front to back. Pat the area to dry it.  Sitting on an inflatable ring or pillow may provide comfort.  Take medicines only as directed by your health care provider.   Contact a health care provider if:  Your pain is not relieved with medicines.  You have painful urination.  You have a fever. Get help right away if:  You have redness, swelling, or increasing pain in the area of the tear.  You have pus coming from the area of the tear.  You notice a bad smell coming from the area of the tear.  Your tear opens.  You notice swelling in the area of the tear that is larger than when you left the hospital.  You cannot urinate. This information is not intended to replace advice given to you by your health care provider. Make sure you discuss any questions you have with your health care provider. Document Released: 02/02/2014 Document Revised: 03/01/2016 Document Reviewed: 06/24/2013 Elsevier Interactive Patient Education  2017 [employer].

## 2020-03-16 NOTE — Progress Notes (Signed)
Discharge order received from doctor. Sitz bath sent home with patient. Reviewed discharge instructions and prescriptions with patient and answered all questions. Follow up appointment given. Patient verbalized understanding. Patient discharged home via ambulatory (per patient request) by nursing/auxillary.    Inge Rise, RN

## 2020-03-17 LAB — AEROBIC/ANAEROBIC CULTURE W GRAM STAIN (SURGICAL/DEEP WOUND)

## 2020-03-18 LAB — AEROBIC/ANAEROBIC CULTURE W GRAM STAIN (SURGICAL/DEEP WOUND)

## 2020-03-19 LAB — AEROBIC/ANAEROBIC CULTURE W GRAM STAIN (SURGICAL/DEEP WOUND)

## 2020-04-06 ENCOUNTER — Encounter: Payer: Self-pay | Admitting: Obstetrics and Gynecology

## 2020-04-25 ENCOUNTER — Other Ambulatory Visit: Payer: Self-pay

## 2020-04-25 ENCOUNTER — Encounter: Payer: Self-pay | Admitting: Emergency Medicine

## 2020-04-25 ENCOUNTER — Emergency Department
Admission: EM | Admit: 2020-04-25 | Discharge: 2020-04-25 | Disposition: A | Discharge status: Home / Self Care | Payer: Medicaid Other | Attending: Emergency Medicine | Admitting: Emergency Medicine

## 2020-04-25 DIAGNOSIS — N94819 Vulvodynia, unspecified: Secondary | ICD-10-CM | POA: Diagnosis present

## 2020-04-25 DIAGNOSIS — Z79899 Other long term (current) drug therapy: Secondary | ICD-10-CM | POA: Insufficient documentation

## 2020-04-25 DIAGNOSIS — N949 Unspecified condition associated with female genital organs and menstrual cycle: Secondary | ICD-10-CM

## 2020-04-25 LAB — URINALYSIS, COMPLETE (UACMP) WITH MICROSCOPIC
Bacteria, UA: NONE SEEN
Bilirubin Urine: NEGATIVE
Glucose, UA: NEGATIVE mg/dL
Hgb urine dipstick: NEGATIVE
Ketones, ur: NEGATIVE mg/dL
Nitrite: NEGATIVE
Protein, ur: 30 mg/dL — AB
Specific Gravity, Urine: 1.02 (ref 1.005–1.030)
WBC, UA: >50 WBC/hpf — ABNORMAL HIGH (ref 0–5)
pH: 6 (ref 5.0–8.0)

## 2020-04-25 LAB — POCT PREGNANCY, URINE: Preg Test, Ur: NEGATIVE

## 2020-04-25 NOTE — ED Provider Notes (Signed)
Emergency Department Provider Note  ____________________________________________  Time seen: Approximately 5:42 PM  I have reviewed the triage vital signs and the nursing notes.   HISTORY  Chief Complaint Rash   Historian Patient     HPI Jasmin Zimmerman is a 19 y.o. female presents to the emergency department with a burning sensation along the right labia.  Patient sustained a second-degree laceration during childbirth and had an extensive repair undertaken by her OB/GYN in the month of June.  Patient states that she had a recheck yesterday and gynecologist was reassured by healing.  Patient states that she has had a burning sensation along labia since childbirth but it has been worse for the past week.  She denies dysuria, hematuria or increased urinary frequency.  No low back pain.  She is in a monogamous relationship with her husband and has no concerns for STDs.  She has not noticed any rashes.  No fever or chills at home.   Past Medical History:  Diagnosis Date  . Anemia affecting pregnancy in third trimester 01/22/2020  . Biliary colic 08/27/2019   @ ED-small gallstones seem     Immunizations up to date:  Yes.     Past Medical History:  Diagnosis Date  . Anemia affecting pregnancy in third trimester 01/22/2020  . Biliary colic 08/27/2019   @ ED-small gallstones seem    Patient Active Problem List   Diagnosis Date Noted  . Postpartum endometritis 03/14/2020  . PPH (postpartum hemorrhage) 03/09/2020  . NSVD (normal spontaneous vaginal delivery) 03/09/2020  . Anemia affecting pregnancy in third trimester 01/22/2020  . Encounter for antenatal screening for chromosomal anomalies   . Supervision of normal first pregnancy, antepartum 09/03/2019  . History of biliary colic 09/03/2019    Past Surgical History:  Procedure Laterality Date  . left shoulder surgery Left    Left shoulder surgery in middle school, unsure date and reason  . PERINEAL LACERATION REPAIR   03/14/2020   Procedure: SUTURE REPAIR PERINEAL LACERATION;  Surgeon: Christeen Douglas, MD;  Location: [facility] ORS;  Service: Gynecology;;  . REPAIR VAGINAL CUFF N/A 03/09/2020   Procedure: REPAIR VAGINAL CUFF;  Surgeon: Suzy Bouchard, MD;  Location: [facility] ORS;  Service: Gynecology;  Laterality: N/A;  . WISDOM TOOTH EXTRACTION Bilateral 2018    Prior to Admission medications   Medication Sig Start Date End Date Taking? Authorizing Provider  acetaminophen (TYLENOL) 325 MG tablet Take 2 tablets (650 mg total) by mouth every 4 (four) hours as needed (for pain scale < 4). 03/11/20   McVey, Prudencio Pair, CNM  benzocaine-Menthol (DERMOPLAST) 20-0.5 % AERO Apply 1 application topically as needed for irritation (perineal discomfort). 03/11/20   McVey, Prudencio Pair, CNM  calcium carbonate (TUMS - DOSED IN MG ELEMENTAL CALCIUM) 500 MG chewable tablet Chew 1 tablet by mouth daily.    [provider]  ferrous sulfate 324 (65 Fe) MG TBEC Take 1 tablet (325 mg total) by mouth in the morning, at noon, and at bedtime. 12/23/19 01/22/20  Federico Flake, MD  ibuprofen (ADVIL) 600 MG tablet Take 1 tablet (600 mg total) by mouth every 6 (six) hours. 03/11/20   McVey, Prudencio Pair, CNM  oxyCODONE-acetaminophen (PERCOCET) 10-325 MG tablet Take 1 tablet by mouth every 4 (four) hours as needed for pain. 03/16/20   Christeen Douglas, MD  Prenatal Vit-Fe Fumarate-FA (MULTIVITAMIN-PRENATAL) 27-0.8 MG TABS tablet Take 1 tablet by mouth daily at 12 noon.    [provider]  senna-docusate (SENOKOT-S) 8.6-50 MG  tablet Take 2 tablets by mouth daily. 03/12/20   McVey, Prudencio Pair, CNM  witch hazel-glycerin (TUCKS) pad Apply 1 application topically 4 (four) times daily. 03/11/20   McVey, Prudencio Pair, CNM    Allergies Patient has no known allergies.  Family History  Problem Relation Age of Onset  . Diabetes Maternal Uncle   . Diabetes Maternal Grandmother   . Vision loss Maternal Grandmother   . Diabetes Maternal  Grandfather   . Kidney disease Maternal Grandfather     Social History Social History   Tobacco Use  . Smoking status: Never Smoker  . Smokeless tobacco: Never Used  Vaping Use  . Vaping Use: Never used  Substance Use Topics  . Alcohol use: Never  . Drug use: Never     Review of Systems  Constitutional: No fever/chills Eyes:  No discharge ENT: No upper respiratory complaints. Respiratory: no cough. No SOB/ use of accessory muscles to breath Gastrointestinal:   No nausea, no vomiting.  No diarrhea.  No constipation. Musculoskeletal: Negative for musculoskeletal pain. Skin: Patient has burning of right labia.     ____________________________________________   PHYSICAL EXAM:  VITAL SIGNS: ED Triage Vitals  Enc Vitals Group     BP 04/25/20 1637 122/71     Pulse Rate 04/25/20 1637 88     Resp 04/25/20 1637 20     Temp 04/25/20 1637 98.8 F (37.1 C)     Temp Source 04/25/20 1637 Oral     SpO2 04/25/20 1637 96 %     Weight 04/25/20 1630 190 lb (86.2 kg)     Height 04/25/20 1630 5\' 1"  (1.549 m)     Head Circumference --      Peak Flow --      Pain Score 04/25/20 1629 7     Pain Loc --      Pain Edu? --      Excl. in GC? --      Constitutional: Alert and oriented. Well appearing and in no acute distress. Eyes: Conjunctivae are normal. PERRL. EOMI. Head: Atraumatic. ENT:      Nose: No congestion/rhinnorhea.      Mouth/Throat: Mucous membranes are moist.  Neck: No stridor.  No cervical spine tenderness to palpation. Cardiovascular: Normal rate, regular rhythm. Normal S1 and S2.  Good peripheral circulation. Respiratory: Normal respiratory effort without tachypnea or retractions. Lungs CTAB. Good air entry to the bases with no decreased or absent breath sounds Gastrointestinal: Bowel sounds x 4 quadrants. Soft and nontender to palpation. No guarding or rigidity. No distention. Musculoskeletal: Full range of motion to all extremities. No obvious deformities  noted Neurologic:  Normal for age. No gross focal neurologic deficits are appreciated.  Skin: No erythema of the right labia.  There is no palpable induration or fluctuance.  Laceration site appears well-healed. Psychiatric: Mood and affect are normal for age. Speech and behavior are normal.   ____________________________________________   LABS (all labs ordered are listed, but only abnormal results are displayed)  Labs Reviewed  URINALYSIS, COMPLETE (UACMP) WITH MICROSCOPIC - Abnormal; Notable for the following components:      Result Value   Color, Urine YELLOW (*)    APPearance HAZY (*)    Protein, ur 30 (*)    Leukocytes,Ua MODERATE (*)    WBC, UA >50 (*)    All other components within normal limits  POC URINE PREG, ED  POCT PREGNANCY, URINE   ____________________________________________  EKG   ____________________________________________  RADIOLOGY 04/27/20  Joseph Art, personally viewed and evaluated these images (plain radiographs) as part of my medical decision making, as well as reviewing the written report by the radiologist.    No results found.  ____________________________________________    PROCEDURES  Procedure(s) performed:     Procedures     Medications - No data to display   ____________________________________________   INITIAL IMPRESSION / ASSESSMENT AND PLAN / ED COURSE  Pertinent labs & imaging results that were available during my care of the patient were reviewed by me and considered in my medical decision making (see chart for details).      Assessment and Plan:  Vaginal burning. 19 year old female presents to the emergency department with a burning sensation along the right labia.  Vital signs were reassuring at triage.  On physical exam, patient had no rash, erythema, induration or fluctuance.  I have low suspicion for her cystitis as patient has not had dysuria, increased urinary frequency or low back pain.  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  Recommended continuing to observe her symptoms at home.  Suggested that sensitizing skin might help alleviate pain.  Recommended follow-up with her OB/GYN if symptoms continue to persist.  ____________________________________________  FINAL CLINICAL IMPRESSION(S) / ED DIAGNOSES  Final diagnoses:  Vaginal burning      NEW MEDICATIONS STARTED DURING THIS VISIT:  ED Discharge Orders    None          This chart was dictated using voice recognition software/Dragon. Despite best efforts to proofread, errors can occur which can change the meaning. Any change was purely unintentional.     Gasper Lloyd 04/25/20 2042    Emily Filbert, MD 04/25/20 2134

## 2020-04-25 NOTE — ED Triage Notes (Signed)
Pt reports has a rash on her vagina that burns. Pt reports that she went to her GYN yesterday and they told her she was fine.

## 2020-04-25 NOTE — ED Notes (Signed)
Pt provided urine specimen cup. Denies having to urinate at this time.

## 2020-04-26 ENCOUNTER — Encounter: Payer: Self-pay | Admitting: Family Medicine

## 2020-04-26 ENCOUNTER — Ambulatory Visit: Payer: Medicaid Other | Admitting: Family Medicine

## 2020-04-26 DIAGNOSIS — R102 Pelvic and perineal pain: Secondary | ICD-10-CM

## 2020-04-26 DIAGNOSIS — Z30013 Encounter for initial prescription of injectable contraceptive: Secondary | ICD-10-CM

## 2020-04-26 DIAGNOSIS — Z3009 Encounter for other general counseling and advice on contraception: Secondary | ICD-10-CM

## 2020-04-26 DIAGNOSIS — O99013 Anemia complicating pregnancy, third trimester: Secondary | ICD-10-CM

## 2020-04-26 LAB — URINALYSIS
Bilirubin, UA: NEGATIVE
Glucose, UA: NEGATIVE
Nitrite, UA: NEGATIVE
Specific Gravity, UA: 1.03 (ref 1.005–1.030)
Urobilinogen, Ur: 0.2 mg/dL (ref 0.2–1.0)
pH, UA: 5.5 (ref 5.0–7.5)

## 2020-04-26 LAB — HEMOGLOBIN, FINGERSTICK: Hemoglobin: 11.7 g/dL (ref 11.1–15.9)

## 2020-04-26 LAB — WET PREP FOR TRICH, YEAST, CLUE
Trichomonas Exam: NEGATIVE
Yeast Exam: NEGATIVE

## 2020-04-26 MED ORDER — MEDROXYPROGESTERONE ACETATE 150 MG/ML IM SUSP
150.0000 mg | INTRAMUSCULAR | Status: DC
  Administered 2020-04-26 – 2020-10-04 (×3): 150 mg via INTRAMUSCULAR

## 2020-04-26 MED ORDER — THERA VITAL M PO TABS: 1.0000 | ORAL_TABLET | Freq: Every day | ORAL | 0 refills | Status: DC

## 2020-04-26 NOTE — Progress Notes (Signed)
Post Partum Exam  Jasmin Zimmerman is a 19 y.o. G42P1001 female who presents for a postpartum visit. She is 7 weeks postpartum following a spontaneous vaginal delivery. I have fully reviewed the prenatal and intrapartum course. Labor augmented w/AROM, pitocin d/t prolonged active phase. Had pp hemorrhage , vaginal laceration at sidewall and sulcus - had OR repair. Also w/infection, s/p gent/clinda at 5 days pp on 6/13 (per PL likely endometritis).The delivery was at [redacted]w[redacted]d gestational weeks.  Anesthesia: epidural.   Baby's course has been going well. Baby is feeding by Bottle. Bleeding no bleeding (States vaginal bleeding/period from last week has stopped). Bowel function is normal. Bladder function is normal. Patient is not sexually active. Contraception method is none - she would like depo.  Since delivery she continues to have have pain at vaginal tear/repair site. States pain is somewhat internal, not particularly tender when area is touched. Went to ER yesterday for R labia pain, occasional burning w/urination x2 days. Per ER note dx "suspicious for postsurgical neuropathic pain.  Recommended continuing to observe her symptoms at home.  Suggested that sensitizing skin might help alleviate pain.  Recommended follow-up with her OB/GYN if symptoms continue to persist." She has followed up w/[facility] prior to this w/reports of appropriate healing, has been prescribed estrogen cream. Endorses vaginal discharge since giving birth. Also with occasional burning w/urination for past 2-3 days. Denies urinary frequency, abd pain or fever.  She would like Depo. Pt denies all of the following, which are contraindications to Depo use: Known breast cancer Pregnancy Also denies: Hypertension (CDC cat 2 if mild, cat 3 if severe) Severe cirrhosis, hepatocellular adenoma Diabetes with nephrosis or vascular complications Ischemic heart disease or multiple risk factors for atherosclerotic disease, and some forms of  lupus Unexplained vaginal bleeding Pregnancy planned within the next year Long-term use of corticosteroid therapy in women with a history of, or risk factors for, nontraumatic (frailty) fractures.  Current use of aminoglutethimide (usually for the treatment of Cushing's syndrome) because aminoglutethimide may increase metabolism of progestins   Postpartum depression screening:  Edinburgh Postnatal Depression Scale - 04/26/20 1404      Edinburgh Postnatal Depression Scale:  In the Past 7 Days   I have been able to laugh and see the funny side of things. 0    I have looked forward with enjoyment to things. 0    I have blamed myself unnecessarily when things went wrong. 1    I have been anxious or worried for no good reason. 0    I have felt scared or panicky for no good reason. 0    Things have been getting on top of me. 1    I have been so unhappy that I have had difficulty sleeping. 0    I have felt sad or miserable. 0    I have been so unhappy that I have been crying. 0    The thought of harming myself has occurred to me. 0    Edinburgh Postnatal Depression Scale Total 2           The following portions of the patient's history were reviewed and updated as appropriate: allergies, current medications, past family history, past medical history, past social history, past surgical history, and problem list.  Review of Systems A comprehensive 10 point review of systems was negative except for:  Wt loss/gain: d/t pregnancy      Objective:  BP (!) 94/60   Wt 188 lb 12.8 oz (85.6 kg)  LMP 04/22/2020 (Exact Date)   Breastfeeding No Comment: no longer breastfeeding  BMI 35.67 kg/m   Gen: well appearing, NAD HEENT: no scleral icterus, thyroid not enlarged  CV: regular rate and rhythm Lung: clear to auscultation bilaterally Breast: exam not indicated Abdomen: nontender, uterus appropriately involuted. GU: external genitalia normal, nontender; vagina w/healing laceration R side  wall, nontender; cervix normal, no CMT; no adnexal tenderness or masses. Rectal: exam not indicated Ext: warm well perfused, no signs of DVT        Assessment:    Normal postpartum exam with exception of continued vaginal pain s/p lac repair. Pap smear not done at today's visit.   Plan:   Essential components of care per ACOG recommendations for Comprehensive Postpartum exam:  1.  Mood and well being: Patient with negative depression screening today. Reviewed local resources for support. EPDS is low risk. Reviewed resources and that mood sx in first year after pregnancy are considered related to pregnancy and to reach out for help at [facility] if needed. Discussed [facility] as link to care and availability of LCSW for counseling. - Patient does not use tobacco.  - hx of drug use? no  2. Infant care and feeding:  -Patient currently breastmilk feeding? no  If breastmilk feeding discussed return to work and pumping. If needed, patient was provided letter for work to allow for every 2-3 hr pumping breaks, and to be granted a private location to express breastmilk and refrigerated area to store breastmilk. Reviewed importance of draining breast regularly to support lactation. I  -Recommended patient engage with WIC/BFpeer counselors  -Counseled to sign new child up for [facility] services -Social determinants of health (SDOH) reviewed in EPIC. No concerns.   3. Sexuality, contraception and birth spacing -Contraception: Contraception counseling: Reviewed all forms of birth control options in the tiered based approach. available including abstinence; over the counter/barrier methods; hormonal contraceptive medication including pill, patch, ring, injection,contraceptive implant; hormonal and nonhormonal IUDs; permanent sterilization options including vasectomy and the various tubal sterilization modalities. Risks, benefits, and typical effectiveness rates were reviewed.  Questions were answered.  Written information  was also given to the patient to review.  Patient desires depo, this was prescribed for patient. She will follow up in  3 months for surveillance.  She was told to call with any further questions, or with any concerns about this method of contraception.  Emphasized use of condoms 100% of the time for STI prevention. -ECP: n/a - Patient does not want a pregnancy in the next year.  Desired family size is 2 children.  - Reviewed forms of contraception in tiered fashion. Patient desired depo today.   - Discussed birth spacing of 18 months  4. Sleep and fatigue -Encouraged family/partner/community support of 4 hrs of uninterrupted sleep to help with mood and fatigue  5. Physical Recovery  - Discussed patients delivery and complications - Patient had a 2nd degree laceration, perineal healing reviewed. Patient expressed understanding. - Patient has urinary incontinence? No - Patient is not yet safe to resume sexual activity - advised further f/u with obgyn for labial pain - see #8  6.  Health Maintenance/Chronic Disease - Last pap smear performed - never d/t age. -Screening mammograms will be due age 45.  7. Postpartum exam -Pt requests STI screen today. Wet prep results wnl. - WET PREP FOR TRICH, YEAST, CLUE - Chlamydia/Gonorrhea [facility] - HIV [place] [facility] - Syphilis Serology, [place] [facility] - medroxyPROGESTERone (DEPO-PROVERA) injection 150 mg  8. Vaginal pain -Vaginal pain continues s/p laceration repair 7 wks ago, still healing. No tenderness or signs of infection on exam today. Advised pt to call today for f/u with [facility] Ob[facility] for this. -As she endorses occasional burning w/urination for past 2 days will also get urine culture. - Urinalysis (Urine Dip)  9. Anemia affecting pregnancy in third trimester -Has future appts already scheduled w/Hematology for continued f/u  -Hgb today results 11+ - Hemoglobin, venipuncture   Patient given handout about PCP care in the community Given  MVI per family planning program guidelines and availability  Follow up with [facility] asap or with [facility] as needed.

## 2020-04-26 NOTE — Progress Notes (Signed)
In house labs reviewed with provider, provider ordering urine culture. Depo given, right deltoid, tolerated well, next Depo card given. Patient aware of HemOn[facility] appointments on 09/06/20 and 09/07/20. Marland KitchenBurt Knack, RN

## 2020-04-26 NOTE — Progress Notes (Signed)
Patient here for PP visit. Had SVD on 03/09/20, with repair 03/09/20 and 03/14/2020. Patient interested in Depo today, states no sex since birth of baby boy, [place]. Hgb check today, and patient c/o burning in perineum and states she went to ED yesterday due to burning and states they did a urine test "but didn't find anything". Burt Knack, RN

## 2020-04-28 LAB — URINE CULTURE

## 2020-07-12 ENCOUNTER — Other Ambulatory Visit: Payer: Self-pay

## 2020-07-12 ENCOUNTER — Ambulatory Visit (LOCAL_COMMUNITY_HEALTH_CENTER): Payer: Medicaid Other

## 2020-07-12 VITALS — BP 109/71 | Ht 61.0 in | Wt 193.5 lb

## 2020-07-12 DIAGNOSIS — Z30013 Encounter for initial prescription of injectable contraceptive: Secondary | ICD-10-CM

## 2020-07-12 DIAGNOSIS — Z3042 Encounter for surveillance of injectable contraceptive: Secondary | ICD-10-CM

## 2020-07-12 DIAGNOSIS — Z3009 Encounter for other general counseling and advice on contraception: Secondary | ICD-10-CM

## 2020-07-12 NOTE — Progress Notes (Signed)
Pt is 11.0 weeks post depo today. DMPA 150 mg IM administered today per Samara Snide, PA order dated 04/26/20 (see visit orders/MAR for order 04/26/20-04/21/21, 4 doses).

## 2020-09-04 DIAGNOSIS — D509 Iron deficiency anemia, unspecified: Secondary | ICD-10-CM | POA: Insufficient documentation

## 2020-09-04 NOTE — Progress Notes (Deleted)
[facility]  Telephone:([phone]) [phone] Fax:(336) [phone]  ID: Jasmin Zimmerman OB: 2000/12/21  MR#: [id number]  OMV#:[id number]  Patient Care Team: Center, [facility] as PCP - General (General Practice)  I connected with Vernie Ammons on 09/04/20 at  2:00 PM EST by {Blank single:19197::"video enabled telemedicine visit","telephone visit"} and verified that I am speaking with the correct person using two identifiers.   I discussed the limitations, risks, security and privacy concerns of performing an evaluation and management service by telemedicine and the availability of in-person appointments. I also discussed with the patient that there may be a patient responsible charge related to this service. The patient expressed understanding and agreed to proceed.   Other persons participating in the visit and their role in the encounter: Patient, MD.  Patient's location: Home. Provider's location: Clinic.  CHIEF COMPLAINT: Iron deficiency anemia.  INTERVAL HISTORY: Patient returns to clinic today for repeat laboratory work, further evaluation, and consideration of additional IV Feraheme.  She continues to feel well and is asymptomatic.  She has no neurologic complaints.  She denies any recent fevers or illnesses.  She is gaining weight appropriately.  She has no chest pain, shortness of breath, cough, or hemoptysis.  She denies any nausea, vomiting, constipation, or diarrhea.  She has no melena or hematochezia.  She has no urinary complaints.  Patient offers no specific complaints today.  REVIEW OF SYSTEMS:   Review of Systems  Constitutional: Negative.  Negative for fever, malaise/fatigue and weight loss.  Respiratory: Negative.  Negative for cough and shortness of breath.   Cardiovascular: Negative.  Negative for chest pain and leg swelling.  Gastrointestinal: Negative.  Negative for abdominal pain, blood in stool and melena.  Genitourinary:  Negative.  Negative for hematuria.  Musculoskeletal: Negative.   Skin: Negative.  Negative for rash.  Neurological: Negative.  Negative for dizziness, focal weakness, weakness and headaches.  Psychiatric/Behavioral: Negative.  The patient is not nervous/anxious.     As per HPI. Otherwise, a complete review of systems is negative.  PAST MEDICAL HISTORY: Past Medical History:  Diagnosis Date  . Anemia affecting pregnancy in third trimester 01/22/2020  . Biliary colic 08/27/2019   @ ED-small gallstones seem    PAST SURGICAL HISTORY: Past Surgical History:  Procedure Laterality Date  . left shoulder surgery Left    Left shoulder surgery in middle school, unsure date and reason  . PERINEAL LACERATION REPAIR  03/14/2020   Procedure: SUTURE REPAIR PERINEAL LACERATION;  Surgeon: Christeen Douglas, MD;  Location: [facility] ORS;  Service: Gynecology;;  . REPAIR VAGINAL CUFF N/A 03/09/2020   Procedure: REPAIR VAGINAL CUFF;  Surgeon: Suzy Bouchard, MD;  Location: [facility] ORS;  Service: Gynecology;  Laterality: N/A;  . WISDOM TOOTH EXTRACTION Bilateral 2018    FAMILY HISTORY: Family History  Problem Relation Age of Onset  . Diabetes Maternal Uncle   . Diabetes Maternal Grandmother   . Vision loss Maternal Grandmother   . Diabetes Maternal Grandfather   . Kidney disease Maternal Grandfather   . Cancer Maternal Aunt     ADVANCED DIRECTIVES (Y/N):  N  HEALTH MAINTENANCE: Social History   Tobacco Use  . Smoking status: Never Smoker  . Smokeless tobacco: Never Used  Vaping Use  . Vaping Use: Never used  Substance Use Topics  . Alcohol use: Never  . Drug use: Never     Colonoscopy:  PAP:  Bone density:  Lipid panel:  No Known Allergies  Current Outpatient Medications  Medication Sig Dispense Refill  . acetaminophen (TYLENOL) 325 MG tablet Take 2 tablets (650 mg total) by mouth every 4 (four) hours as needed (for pain scale < 4). (Patient not taking: Reported on 07/12/2020)     . benzocaine-Menthol (DERMOPLAST) 20-0.5 % AERO Apply 1 application topically as needed for irritation (perineal discomfort). (Patient not taking: Reported on 07/12/2020)    . calcium carbonate (TUMS - DOSED IN MG ELEMENTAL CALCIUM) 500 MG chewable tablet Chew 1 tablet by mouth daily. (Patient not taking: Reported on 04/26/2020)    . ferrous sulfate 324 (65 Fe) MG TBEC Take 1 tablet (325 mg total) by mouth in the morning, at noon, and at bedtime. 100 tablet 0  . ibuprofen (ADVIL) 600 MG tablet Take 1 tablet (600 mg total) by mouth every 6 (six) hours. (Patient not taking: Reported on 07/12/2020) 90 tablet 0  . Multiple Vitamins-Minerals (MULTIVITAMIN) tablet Take 1 tablet by mouth daily. 100 tablet 0  . oxyCODONE-acetaminophen (PERCOCET) 10-325 MG tablet Take 1 tablet by mouth every 4 (four) hours as needed for pain. (Patient not taking: Reported on 07/12/2020) 30 tablet 0  . Prenatal Vit-Fe Fumarate-FA (MULTIVITAMIN-PRENATAL) 27-0.8 MG TABS tablet Take 1 tablet by mouth daily at 12 noon. (Patient not taking: Reported on 07/12/2020)    . senna-docusate (SENOKOT-S) 8.6-50 MG tablet Take 2 tablets by mouth daily. (Patient not taking: Reported on 04/26/2020)    . witch hazel-glycerin (TUCKS) pad Apply 1 application topically 4 (four) times daily. (Patient not taking: Reported on 07/12/2020) 40 each 12   Current Facility-Administered Medications  Medication Dose Route Frequency Provider Last Rate Last Admin  . medroxyPROGESTERone (DEPO-PROVERA) injection 150 mg  150 mg Intramuscular Q90 days Staples, Jenna L, PA-C   150 mg at 07/12/20 0855    OBJECTIVE: There were no vitals filed for this visit.   There is no height or weight on file to calculate BMI.    ECOG FS:0 - Asymptomatic  General: Well-developed, well-nourished, no acute distress. Eyes: Pink conjunctiva, anicteric sclera. HEENT: Normocephalic, moist mucous membranes. Lungs: No audible wheezing or coughing. Heart: Regular rate and  rhythm. Abdomen: Appears appropriate for gestational age. Musculoskeletal: No edema, cyanosis, or clubbing. Neuro: Alert, answering all questions appropriately. Cranial nerves grossly intact. Skin: No rashes or petechiae noted. Psych: Normal affect.    LAB RESULTS:  Lab Results  Component Value Date   NA 143 03/16/2020   K 4.0 03/16/2020   CL 109 03/16/2020   CO2 26 03/16/2020   GLUCOSE 94 03/16/2020   BUN 14 03/16/2020   CREATININE 0.68 03/16/2020   CALCIUM 8.3 (L) 03/16/2020   PROT 5.4 (L) 03/16/2020   ALBUMIN 2.2 (L) 03/16/2020   AST 25 03/16/2020   ALT 21 03/16/2020   ALKPHOS 70 03/16/2020   BILITOT 0.4 03/16/2020   GFRNONAA >60 03/16/2020   GFRAA >60 03/16/2020    Lab Results  Component Value Date   WBC 7.3 03/16/2020   NEUTROABS 6.4 03/13/2020   HGB 8.5 (L) 03/16/2020   HCT 25.2 (L) 03/16/2020   MCV 82.4 03/16/2020   PLT 344 03/16/2020   Lab Results  Component Value Date   IRON 81 03/02/2020   TIBC 430 03/02/2020   IRONPCTSAT 19 03/02/2020   Lab Results  Component Value Date   FERRITIN 51 03/02/2020     STUDIES: No results found.  ASSESSMENT: Iron deficiency anemia.  PLAN:    1. Iron deficiency anemia: Patient's hemoglobin has significantly improved and now nearly within normal limits  at 11.9.  Iron stores are within normal limits. She reports she cannot tolerate oral iron supplementation.  She does not require additional IV Feraheme.  Patient last received treatment on January 29, 2020.  Return to clinic in 6 months with repeat laboratory work and video assisted telemedicine visit. 2.  Pregnancy: Patient's due date is the next week on March 12, 2020.  I provided *** minutes of {Blank single:19197::"face-to-face video visit time","non face-to-face telephone visit time"} during this encounter which included chart review, counseling, and coordination of care as documented above.   Patient expressed understanding and was in agreement with this plan.  She also understands that She can call clinic at any time with any questions, concerns, or complaints.    Jeralyn Ruths, MD   09/04/2020 8:04 AM

## 2020-09-06 ENCOUNTER — Inpatient Hospital Stay: Payer: Medicaid Other | Attending: Oncology

## 2020-09-07 ENCOUNTER — Inpatient Hospital Stay: Payer: Medicaid Other | Admitting: Oncology

## 2020-09-07 DIAGNOSIS — D509 Iron deficiency anemia, unspecified: Secondary | ICD-10-CM

## 2020-09-13 ENCOUNTER — Inpatient Hospital Stay: Payer: Medicaid Other

## 2020-09-19 ENCOUNTER — Ambulatory Visit
Admission: EM | Admit: 2020-09-19 | Discharge: 2020-09-19 | Disposition: A | Discharge status: Home / Self Care | Payer: Medicaid Other | Attending: Family Medicine | Admitting: Family Medicine

## 2020-09-19 ENCOUNTER — Other Ambulatory Visit: Payer: Self-pay

## 2020-09-19 ENCOUNTER — Encounter: Payer: Self-pay | Admitting: Emergency Medicine

## 2020-09-19 DIAGNOSIS — U071 COVID-19: Secondary | ICD-10-CM | POA: Insufficient documentation

## 2020-09-19 LAB — RESP PANEL BY RT-PCR (FLU A&B, COVID) ARPGX2
Influenza A by PCR: NEGATIVE
Influenza B by PCR: NEGATIVE
SARS Coronavirus 2 by RT PCR: POSITIVE — AB

## 2020-09-19 MED ORDER — BENZONATATE 100 MG PO CAPS
200.0000 mg | ORAL_CAPSULE | Freq: Three times a day (TID) | ORAL | 0 refills | Status: DC
Start: 2020-09-19 — End: 2023-06-07

## 2020-09-19 MED ORDER — AMOXICILLIN-POT CLAVULANATE 875-125 MG PO TABS
1.0000 | ORAL_TABLET | Freq: Two times a day (BID) | ORAL | 0 refills | Status: AC
Start: 2020-09-19 — End: 2020-09-29

## 2020-09-19 MED ORDER — PROMETHAZINE-DM 6.25-15 MG/5ML PO SYRP
5.0000 mL | ORAL_SOLUTION | Freq: Four times a day (QID) | ORAL | 0 refills | Status: DC | PRN
Start: 2020-09-19 — End: 2023-06-07

## 2020-09-19 NOTE — ED Triage Notes (Signed)
Patient c/o cough, chest congestion and HAs that started 2 weeks ago.  Patient denies fever.

## 2020-09-19 NOTE — ED Provider Notes (Signed)
[facility]    CSN: [id number] Arrival date & time: 09/19/20  1191      History   Chief Complaint Chief Complaint  Patient presents with  . Cough    HPI Jasmin Zimmerman is a 19 y.o. female.   HPI   19 year old female here for evaluation of cough, chest congestion, and headache that is been going for last 2 weeks.  Patient has had a yellow-green nasal discharge, ear pain when she coughs, sore throat, intermittent sputum production when she coughs for green sputum, and has been exposed to a sick family member.  Patient denies fever, sinus pain or pressure, shortness of breath or wheezing, GI complaints, changes to her sense of taste or smell.  Patient has had her Covid vaccine but not her flu vaccine.  Past Medical History:  Diagnosis Date  . Anemia affecting pregnancy in third trimester 01/22/2020  . Biliary colic 08/27/2019   @ ED-small gallstones seem    Patient Active Problem List   Diagnosis Date Noted  . Iron deficiency anemia 09/04/2020  . Vaginal pain 04/26/2020  . Postpartum endometritis 03/14/2020  . Anemia affecting pregnancy in third trimester 01/22/2020  . History of biliary colic 09/03/2019    Past Surgical History:  Procedure Laterality Date  . left shoulder surgery Left    Left shoulder surgery in middle school, unsure date and reason  . PERINEAL LACERATION REPAIR  03/14/2020   Procedure: SUTURE REPAIR PERINEAL LACERATION;  Surgeon: Christeen Douglas, MD;  Location: [facility] ORS;  Service: Gynecology;;  . REPAIR VAGINAL CUFF N/A 03/09/2020   Procedure: REPAIR VAGINAL CUFF;  Surgeon: Suzy Bouchard, MD;  Location: [facility] ORS;  Service: Gynecology;  Laterality: N/A;  . WISDOM TOOTH EXTRACTION Bilateral 2018    OB History    Gravida  1   Para  1   Term  1   Preterm  0   AB  0   Living  1     SAB  0   IAB  0   Ectopic  0   Multiple  0   Live Births  1            Home Medications    Prior to Admission  medications   Medication Sig Start Date End Date Taking? Authorizing Provider  acetaminophen (TYLENOL) 325 MG tablet Take 2 tablets (650 mg total) by mouth every 4 (four) hours as needed (for pain scale < 4). Patient not taking: Reported on 07/12/2020 03/11/20   McVey, Prudencio Pair, CNM  amoxicillin-clavulanate (AUGMENTIN) 875-125 MG tablet Take 1 tablet by mouth every 12 (twelve) hours for 10 days. 09/19/20 09/29/20  Becky Augusta, NP  benzocaine-Menthol (DERMOPLAST) 20-0.5 % AERO Apply 1 application topically as needed for irritation (perineal discomfort). Patient not taking: Reported on 07/12/2020 03/11/20   McVey, Prudencio Pair, CNM  benzonatate (TESSALON) 100 MG capsule Take 2 capsules (200 mg total) by mouth every 8 (eight) hours. 09/19/20   Becky Augusta, NP  calcium carbonate (TUMS - DOSED IN MG ELEMENTAL CALCIUM) 500 MG chewable tablet Chew 1 tablet by mouth daily. Patient not taking: Reported on 04/26/2020    [provider]  ibuprofen (ADVIL) 600 MG tablet Take 1 tablet (600 mg total) by mouth every 6 (six) hours. Patient not taking: Reported on 07/12/2020 03/11/20   McVey, Prudencio Pair, CNM  promethazine-dextromethorphan (PROMETHAZINE-DM) 6.25-15 MG/5ML syrup Take 5 mLs by mouth 4 (four) times daily as needed. 09/19/20   Becky Augusta, NP  senna-docusate (SENOKOT-S)  8.6-50 MG tablet Take 2 tablets by mouth daily. Patient not taking: Reported on 04/26/2020 03/12/20   McVey, Prudencio Pair, CNM  ferrous sulfate 324 (65 Fe) MG TBEC Take 1 tablet (325 mg total) by mouth in the morning, at noon, and at bedtime. 12/23/19 09/19/20  Federico Flake, MD    Family History Family History  Problem Relation Age of Onset  . Diabetes Maternal Uncle   . Diabetes Maternal Grandmother   . Vision loss Maternal Grandmother   . Diabetes Maternal Grandfather   . Kidney disease Maternal Grandfather   . Cancer Maternal Aunt     Social History Social History   Tobacco Use  . Smoking status: Never Smoker   . Smokeless tobacco: Never Used  Vaping Use  . Vaping Use: Never used  Substance Use Topics  . Alcohol use: Never  . Drug use: Never     Allergies   Patient has no known allergies.   Review of Systems Review of Systems  Constitutional: Negative for activity change, appetite change and fever.  HENT: Positive for congestion, ear pain, rhinorrhea and sore throat. Negative for sinus pressure and sinus pain.   Respiratory: Positive for cough. Negative for shortness of breath and wheezing.   Cardiovascular: Negative for chest pain.  Gastrointestinal: Negative for abdominal pain, diarrhea, nausea and vomiting.  Musculoskeletal: Negative for arthralgias and myalgias.  Skin: Negative for rash.  Hematological: Negative.   Psychiatric/Behavioral: Negative.      Physical Exam Triage Vital Signs ED Triage Vitals  Enc Vitals Group     BP 09/19/20 1020 123/68     Pulse Rate 09/19/20 1020 60     Resp 09/19/20 1020 14     Temp 09/19/20 1020 98.5 F (36.9 C)     Temp Source 09/19/20 1020 Oral     SpO2 09/19/20 1020 99 %     Weight 09/19/20 1018 190 lb (86.2 kg)     Height 09/19/20 1018 5' 1.5" (1.562 m)     Head Circumference --      Peak Flow --      Pain Score 09/19/20 1018 0     Pain Loc --      Pain Edu? --      Excl. in GC? --    No data found.  Updated Vital Signs BP 123/68 (BP Location: Left Arm)   Pulse 60   Temp 98.5 F (36.9 C) (Oral)   Resp 14   Ht 5' 1.5" (1.562 m)   Wt 190 lb (86.2 kg)   SpO2 99%   Breastfeeding No   BMI 35.32 kg/m   Visual Acuity Right Eye Distance:   Left Eye Distance:   Bilateral Distance:    Right Eye Near:   Left Eye Near:    Bilateral Near:     Physical Exam Vitals and nursing note reviewed.  Constitutional:      General: She is not in acute distress.    Appearance: Normal appearance. She is not toxic-appearing.  HENT:     Head: Normocephalic and atraumatic.     Right Ear: Tympanic membrane, ear canal and external ear  normal. There is no impacted cerumen.     Left Ear: Tympanic membrane, ear canal and external ear normal. There is no impacted cerumen.     Nose: Congestion and rhinorrhea present.     Comments: This mucosa is erythematous and edematous with purulent discharge.    Mouth/Throat:     Mouth: Mucous membranes are  moist.     Pharynx: Oropharynx is clear. No oropharyngeal exudate or posterior oropharyngeal erythema.  Cardiovascular:     Rate and Rhythm: Normal rate and regular rhythm.     Pulses: Normal pulses.     Heart sounds: Normal heart sounds. No murmur heard.   Pulmonary:     Effort: Pulmonary effort is normal.     Breath sounds: Normal breath sounds. No wheezing, rhonchi or rales.  Musculoskeletal:     Cervical back: Normal range of motion and neck supple.  Lymphadenopathy:     Cervical: No cervical adenopathy.  Skin:    General: Skin is warm and dry.     Capillary Refill: Capillary refill takes less than 2 seconds.     Findings: No erythema or rash.  Neurological:     General: No focal deficit present.     Mental Status: She is alert and oriented to person, place, and time.  Psychiatric:        Mood and Affect: Mood normal.        Behavior: Behavior normal.        Thought Content: Thought content normal.        Judgment: Judgment normal.      UC Treatments / Results  Labs (all labs ordered are listed, but only abnormal results are displayed) Labs Reviewed  RESP PANEL BY RT-PCR (FLU A&B, COVID) ARPGX2 - Abnormal; Notable for the following components:      Result Value   SARS Coronavirus 2 by RT PCR POSITIVE (*)    All other components within normal limits    EKG   Radiology No results found.  Procedures Procedures (including critical care time)  Medications Ordered in UC Medications - No data to display  Initial Impression / Assessment and Plan / UC Course  I have reviewed the triage vital signs and the nursing notes.  Pertinent labs & imaging results  that were available during my care of the patient were reviewed by me and considered in my medical decision making (see chart for details).   Patient is here for evaluation of cough and chest congestion that she has had for 2 weeks.  Patient is also had some yellow-green nasal discharge from her nasal passages.  Physical exam reveals erythematous edematous nasal mucosa with purulent discharge in both nares.  Patient did not have any tenderness to percussion of her sinuses.  Patient's lungs are clear to auscultation in all fields.  Respiratory triplex panel was collected at triage.  Due to duration of symptoms and purulent discharge from nasal passages I will treat patient for sinusitis twice daily with Augmentin for 10 days.  Patient is positive for both Covid and RSV per respiratory panel.   Final Clinical Impressions(s) / UC Diagnoses   Final diagnoses:  COVID-19     Discharge Instructions     You tested positive for Covid today.  Since you have been symptomatic for 2 weeks you are taking the outside of the quarantine.  However you state you have not had improvement of your symptoms.  Once your symptoms improve you can break quarantine.  I am going to treat your puslike nasal discharge with Augmentin twice daily for 10 days.  Take it with food.  Use the Tessalon Perles every 8 hours as needed for cough during the day and the Promethazine DM for cough as needed at bedtime.  Follow-up with your primary care provider if your symptoms do not improve.    ED Prescriptions  Medication Sig Dispense Auth. Provider   amoxicillin-clavulanate (AUGMENTIN) 875-125 MG tablet Take 1 tablet by mouth every 12 (twelve) hours for 10 days. 20 tablet Becky Augustayan, Anddy Wingert, NP   benzonatate (TESSALON) 100 MG capsule Take 2 capsules (200 mg total) by mouth every 8 (eight) hours. 21 capsule Becky Augustayan, Berle Fitz, NP   promethazine-dextromethorphan (PROMETHAZINE-DM) 6.25-15 MG/5ML syrup Take 5 mLs by mouth 4 (four) times daily  as needed. 118 mL Becky Augustayan, Nakeem Murnane, NP     PDMP not reviewed this encounter.   Becky Augustayan, Eduar Kumpf, NP 09/19/20 [phone]

## 2020-09-19 NOTE — Discharge Instructions (Signed)
You tested positive for Covid today.  Since you have been symptomatic for 2 weeks you are taking the outside of the quarantine.  However you state you have not had improvement of your symptoms.  Once your symptoms improve you can break quarantine.  I am going to treat your puslike nasal discharge with Augmentin twice daily for 10 days.  Take it with food.  Use the Tessalon Perles every 8 hours as needed for cough during the day and the Promethazine DM for cough as needed at bedtime.  Follow-up with your primary care provider if your symptoms do not improve.

## 2020-09-20 ENCOUNTER — Inpatient Hospital Stay: Payer: Medicaid Other | Admitting: Oncology

## 2020-09-21 ENCOUNTER — Telehealth (HOSPITAL_COMMUNITY): Payer: Self-pay | Admitting: Emergency Medicine

## 2020-09-21 NOTE — Telephone Encounter (Signed)
Received call from patient that she is experiencing generalized rash and itchiness after taking Augmentin.  Reviewed Benadryl use, ER precautions and told her to stop any more doses of antibiotics.  Followed up with Dr. Leonides Grills, who states no replacement antibiotic needed, patient positive for COVID at this time.  Attempted to reach out to patient to update, LVM

## 2020-10-04 ENCOUNTER — Ambulatory Visit (LOCAL_COMMUNITY_HEALTH_CENTER): Payer: Medicaid Other

## 2020-10-04 ENCOUNTER — Other Ambulatory Visit: Payer: Self-pay

## 2020-10-04 VITALS — BP 111/71 | Ht 61.0 in | Wt 207.5 lb

## 2020-10-04 DIAGNOSIS — Z3009 Encounter for other general counseling and advice on contraception: Secondary | ICD-10-CM

## 2020-10-04 DIAGNOSIS — Z3042 Encounter for surveillance of injectable contraceptive: Secondary | ICD-10-CM

## 2020-10-04 NOTE — Progress Notes (Signed)
12 weeks post depo. Voices no problems. Depo 150 mg IM  given today R Deltoid per order by Maximiano Coss, PA-C dated 04/26/2020. Tolerated well. Next depo due 12/20/2020, pt aware. Jerel Shepherd, RN

## 2020-12-20 ENCOUNTER — Other Ambulatory Visit: Payer: Self-pay

## 2020-12-20 ENCOUNTER — Ambulatory Visit (LOCAL_COMMUNITY_HEALTH_CENTER): Payer: Medicaid Other

## 2020-12-20 VITALS — BP 116/71 | Ht 61.0 in | Wt 219.0 lb

## 2020-12-20 DIAGNOSIS — Z3009 Encounter for other general counseling and advice on contraception: Secondary | ICD-10-CM

## 2020-12-20 NOTE — Progress Notes (Signed)
11 weeks post depo. Here for depo, but has decided she does not want to continue depo d/t wt gain. Interested in another bcm, maybe ocp. Requests provider appt to discuss alternate bcm. [facility] appt scheduled 12/27/2020, 11:05 am, arrival 10:45 am. Pt has reminder. Jerel Shepherd, RN

## 2020-12-27 ENCOUNTER — Ambulatory Visit: Payer: Self-pay

## 2021-01-03 ENCOUNTER — Other Ambulatory Visit: Payer: Self-pay

## 2021-01-03 ENCOUNTER — Telehealth: Payer: Self-pay | Admitting: Family Medicine

## 2021-01-03 ENCOUNTER — Ambulatory Visit (LOCAL_COMMUNITY_HEALTH_CENTER): Payer: Medicaid Other | Admitting: Advanced Practice Midwife

## 2021-01-03 VITALS — BP 108/59 | HR 67 | Ht 61.0 in | Wt 220.6 lb

## 2021-01-03 DIAGNOSIS — E669 Obesity, unspecified: Secondary | ICD-10-CM | POA: Insufficient documentation

## 2021-01-03 DIAGNOSIS — Z30011 Encounter for initial prescription of contraceptive pills: Secondary | ICD-10-CM | POA: Diagnosis not present

## 2021-01-03 DIAGNOSIS — Z3009 Encounter for other general counseling and advice on contraception: Secondary | ICD-10-CM | POA: Diagnosis not present

## 2021-01-03 MED ORDER — NORGESTIM-ETH ESTRAD TRIPHASIC 0.18/0.215/0.25 MG-25 MCG PO TABS: 1.0000 | ORAL_TABLET | Freq: Every day | ORAL | 2 refills | Status: DC

## 2021-01-03 NOTE — Addendum Note (Signed)
Addended by: Arnetha Courser on: 01/03/2021 03:42 PM   Modules accepted: Orders

## 2021-01-03 NOTE — Progress Notes (Signed)
Contraception/Family Planning VISIT ENCOUNTER NOTE  Subjective:   Jasmin Zimmerman is a 20 y.o. SHF G1P1001(10 mo old) female here for reproductive life counseling.  Desires change from [facility].  Reports she does not want a pregnancy in the next year. Denies abnormal vaginal bleeding, discharge, pelvic pain, problems with intercourse or other gynecologic concerns. Pt unhappy with weight gain with DMPA and wants ocp's today with possible IUD.  LMP 12/13/20.  Last sex 01/01/21 without condom; with current partner x 2 years.  Last PE 04/26/20 pp.  Last DMPA 10/04/20 (13 wks). Pap due 04/27/21.     Gynecologic History Patient's last menstrual period was 12/13/2020 (exact date). Contraception: Depo-Provera injections  Health Maintenance Due  Topic Date Due  . Hepatitis C Screening  Never done  . COVID-19 Vaccine (1) Never done  . HPV VACCINES (1 - 2-dose series) Never done  . PAP-Cervical Cytology Screening  Never done  . PAP SMEAR-Modifier  Never done     The following portions of the patient's history were reviewed and updated as appropriate: allergies, current medications, past family history, past medical history, past social history, past surgical history and problem list.  Review of Systems Pertinent items are noted in HPI.   Objective:  BP (!) 108/59   Pulse 67   Ht 5\' 1"  (1.549 m)   Wt 220 lb 9.6 oz (100.1 kg)   LMP 12/13/2020 (Exact Date)   Breastfeeding No   BMI 41.68 kg/m  Gen: well appearing, NAD HEENT: no scleral icterus CV: RR Lung: Normal WOB Ext: warm well perfused     Assessment and Plan:   Contraception counseling: Reviewed all forms of birth control options in the tiered based approach. available including abstinence; over the counter/barrier methods; hormonal contraceptive medication including pill, patch, ring, injection,contraceptive implant, ECP; hormonal and nonhormonal IUDs; permanent sterilization options including vasectomy and the various tubal sterilization  modalities. Risks, benefits, and typical effectiveness rates were reviewed.  Questions were answered.  Written information was also given to the patient to review.  Patient desires ocp's, this was prescribed for patient. She will follow up in  4 wks for surveillance.  She was told to call with any further questions, or with any concerns about this method of contraception.  Emphasized use of condoms 100% of the time for STI prevention.  Patient was offered ECP. ECP was not accepted by the patient. ECP counseling was not given - see RN documentation  1. Family planning Pt doesn't want any more DMPA due to weight gain.  Wants to switch to ocp's and then wants IUD Please give brochures on Paraguard and Mirena - Norgestimate-Ethinyl Estradiol Triphasic (TRI-LO-SPRINTEC) 0.18/0.215/0.25 MG-25 MCG tab; Take 1 tablet by mouth daily.  Dispense: 28 tablet; Refill: 2  2. Encounter for initial prescription of contraceptive pills E-rx Tri Lo Sprintec #3 per pt request   3. Class 3 severe obesity without serious comorbidity in adult, unspecified BMI, unspecified obesity type (HCC)     Please refer to After Visit Summary for other counseling recommendations.   No follow-ups on file.  10-30-1983, CNM [facility] DEPARTMENT

## 2021-01-03 NOTE — Telephone Encounter (Signed)
Return call to client and counseled that message has been sent to provider regarding pharmacy not having her prescription (At today's appt, E. Sciora CNM counseled client to call clinic later today if problem with prescription). Jossie Ng, RN

## 2021-01-03 NOTE — Telephone Encounter (Signed)
PATIENT IS CALLING ABOUT HER PRESCRIPTION, SHE CALLED THE PHARMACY AND THEY SAID THEY DIDN'T RECEIVED ANYTHING. CAN YOU PLEASE RE SEND. THANKS.

## 2021-01-04 NOTE — Telephone Encounter (Signed)
Call to client to verify problem with prescription for birth control pills resolved. Per client, enroute to retrieve pills as received text from pharmacy that available for pick-up. Jossie Ng, RN

## 2021-01-17 ENCOUNTER — Telehealth: Payer: Self-pay | Admitting: Family Medicine

## 2021-01-17 NOTE — Telephone Encounter (Signed)
Patient called to schedule an IUD appointment.

## 2021-01-17 NOTE — Telephone Encounter (Signed)
Call to client and IUD (Paragard) appt scheduled for 01/24/2021 with arrival time of 8:00 am. Client had pp exam 04/26/2020 and 01/03/2021 began ocps. Client states has not missed any ocps since initiation on 01/03/2021. Reports last sex was 01/13/2021 and abstinence encouraged until after IUD insertion. Client verbalized understanding. Jossie Ng, RN

## 2021-01-24 ENCOUNTER — Ambulatory Visit (LOCAL_COMMUNITY_HEALTH_CENTER): Payer: Medicaid Other | Admitting: Advanced Practice Midwife

## 2021-01-24 ENCOUNTER — Encounter: Payer: Self-pay | Admitting: Advanced Practice Midwife

## 2021-01-24 ENCOUNTER — Other Ambulatory Visit: Payer: Self-pay

## 2021-01-24 VITALS — BP 115/65 | HR 73 | Ht 61.0 in | Wt 220.0 lb

## 2021-01-24 DIAGNOSIS — Z3009 Encounter for other general counseling and advice on contraception: Secondary | ICD-10-CM | POA: Diagnosis not present

## 2021-01-24 DIAGNOSIS — Z30430 Encounter for insertion of intrauterine contraceptive device: Secondary | ICD-10-CM

## 2021-01-24 DIAGNOSIS — Z30018 Encounter for initial prescription of other contraceptives: Secondary | ICD-10-CM

## 2021-01-24 MED ORDER — PARAGARD INTRAUTERINE COPPER IU IUD: INTRAUTERINE_SYSTEM | Freq: Once | INTRAUTERINE | Status: DC

## 2021-01-24 MED ORDER — PARAGARD INTRAUTERINE COPPER IU IUD: INTRAUTERINE_SYSTEM | Freq: Once | INTRAUTERINE | Status: AC

## 2021-01-24 NOTE — Addendum Note (Signed)
Addended by: Janos Shampine on: 01/24/2021 09:49 AM   Modules accepted: Orders  

## 2021-01-24 NOTE — Addendum Note (Signed)
Addended by: Jossie Ng on: 01/24/2021 09:38 AM   Modules accepted: Orders

## 2021-01-24 NOTE — Progress Notes (Signed)
Contraception/Family Planning VISIT ENCOUNTER NOTE  Subjective:   Jasmin Zimmerman is a 20 y.o.SHF G1P1001 female here for reproductive life counseling.  Desires Paraguard for [employer].  Reports she does not want a pregnancy in the next year. Denies abnormal vaginal bleeding, discharge, pelvic pain, problems with intercourse or other gynecologic concerns. Pt here for Paraguard insertion.  LMP 12/13/20.  PP exam done 04/26/20.  Last DMPA 10/04/20.  Began ocp's 01/03/21 with last one taken yesterday.  Last sex 01/02/21 without condom; with current partner x 2 years; 1 partner in last 3 mo. Pap due 04/27/21. Last GC/chlamydia 04/26/20.   Gynecologic History Patient's last menstrual period was 12/13/2020 (exact date). Contraception: OCP (estrogen/progesterone)  Health Maintenance Due  Topic Date Due  . Hepatitis C Screening  Never done  . COVID-19 Vaccine (1) Never done  . HPV VACCINES (1 - 2-dose series) Never done  . PAP-Cervical Cytology Screening  Never done  . PAP SMEAR-Modifier  Never done     The following portions of the patient's history were reviewed and updated as appropriate: allergies, current medications, past family history, past medical history, past social history, past surgical history and problem list.  Review of Systems Pertinent items are noted in HPI.   Objective:  BP 115/65   Pulse 73   Ht 5\' 1"  (1.549 m)   Wt 220 lb (99.8 kg)   LMP 12/13/2020 (Exact Date)   Breastfeeding No   BMI 41.57 kg/m  Gen: well appearing, NAD HEENT: no scleral icterus CV: RR Lung: Normal WOB Ext: warm well perfused  PELVIC: Normal appearing external genitalia; normal appearing vaginal mucosa and cervix.  No abnormal discharge noted.  Normal uterine size, no other palpable masses   Assessment and Plan:   Contraception counseling: Reviewed all forms of birth control options in the tiered based approach. available including abstinence; over the counter/barrier methods; hormonal contraceptive  medication including pill, patch, ring, injection,contraceptive implant, ECP; hormonal and nonhormonal IUDs; permanent sterilization options including vasectomy and the various tubal sterilization modalities. Risks, benefits, and typical effectiveness rates were reviewed.  Questions were answered.  Written information was also given to the patient to review.  Patient desires Paraguard, this was prescribed for patient. She will follow up in  prn for surveillance.  She was told to call with any further questions, or with any concerns about this method of contraception.  Emphasized use of condoms 100% of the time for STI prevention.  Patient was offered ECP. ECP was not accepted by the patient. ECP counseling was not given - see RN documentation  1. Family planning   2. Encounter for initial prescription of other contraceptives    Patient presented to [facility] for IUD insertion. Her GC/CT screening was found to be up to date and using WHO criteria we can be reasonably certain she is not pregnant or a pregnancy test was obtained which was Urine pregnancy test  today was N/A.  See Flowsheet for IUD check list  IUD Insertion Procedure Note Patient identified, informed consent performed, consent signed.   Discussed risks of irregular bleeding, cramping, infection, malpositioning or misplacement of the IUD outside the uterus which may require further procedure such as laparoscopy. Time out was performed.    Speculum placed in the vagina.  Cervix visualized.  Cleaned with Betadine x 2.  Grasped anteriorly with a single tooth tenaculum.  Uterus sounded to 7 cm.  IUD placed per manufacturer's recommendations.  Strings trimmed to 3 cm. Tenaculum was removed, good hemostasis  noted.  Patient tolerated procedure well.   Patient was given post-procedure instructions- both agency handout and verbally by provider.  She was advised to have backup contraception for one week.  Patient was also asked to check IUD strings  periodically or follow up in 4 weeks for IUD check.    Please refer to After Visit Summary for other counseling recommendations.   Return in about 11 weeks (around 04/11/2021) for yearly physical exam.  Alberteen Spindle, CNM [facility] DEPARTMENT

## 2021-06-18 IMAGING — US US MFM OB DETAIL+14 WK
1 series · 12 of 28 positions shown · non-contrast
Comparison: none

PATIENT INFO:

PERFORMED BY:
                    Sonographer
SERVICE(S) PROVIDED:
 ----------------------------------------------------------------------
INDICATIONS:
  18 weeks gestation of pregnancy
FETAL EVALUATION:
 Num Of Fetuses:          1
 Fetal Heart Rate(bpm):   143
 Cardiac Activity:        Present
 Presentation:            Variable
 Placenta:                Posterior, No previa
 P. Cord Insertion:       Normal
 Amniotic Fluid
 AFI FV:      Within normal limits
BIOMETRY:
 BPD:      39.6   mm     G. Age:  18w 0d         17  %    CI:         66.76  %    70 - 86
                                                          FL/HC:       18.9  %    16.1 -
 HC:      155.3   mm     G. Age:  18w 3d         24  %
                                                          FL/BPD:      74.0  %
 FL:       29.3   mm     G. Age:  19w 0d         49  %
 HUM:      29.4   mm     G. Age:  19w 4d         71  %
GESTATIONAL AGE:
 LMP:            17w 3d       Date:  06/16/19                   EDD:  03/22/20
 U/S Today:      18w 3d                                         EDD:  03/15/20
 Best:           18w 6d    Det. By:  U/S C R L  (09/11/19)      EDD:  03/12/20
ANATOMY:
 Cavum:                  CSP visualized         Aortic Arch:            Normal appearance
 Ventricles:             Normal appearance      Ductal Arch:            Normal appearance
 Choroid Plexus:         Within Normal Limits   Diaphragm:              Within Normal Limits
 Cerebellum:             Within Normal Limits   Stomach:                Seen
 Posterior Fossa:        Within Normal Limits   Abdomen:                Within Normal  Limits
 Nuchal Fold:            Within Normal Limits   Abdominal Wall:         Normal appearance
 Face:                   Orbits visualized      Cord Vessels:           3 vessels
 Lips:                   Normal appearance      Kidneys:                Normal appearance
 Thoracic:               Within Normal Limits   Bladder:                Seen
 Heart:                  Echogenic focus in     Spine:                  Normal appearance
                         LV
 RVOT:                   Normal appearance      Upper Extremities:      Visualized
 LVOT:                   Normal appearance      Lower Extremities:      Visualized
CERVIX UTERUS ADNEXA:
 Cervix
 Length:             4.1  cm.

[Series 1: us mfm ob detail+14 wk · 0.22mm/px · 12 of 93 slices shown]
[im 4/93]
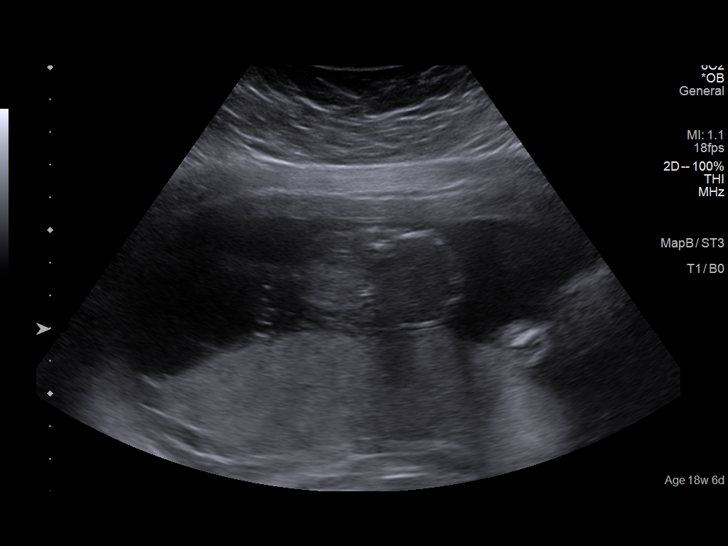
[im 11/93]
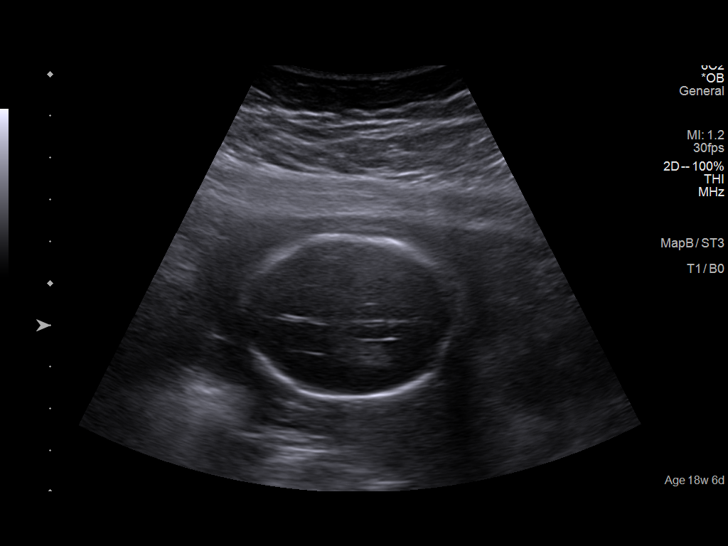
[im 18/93]
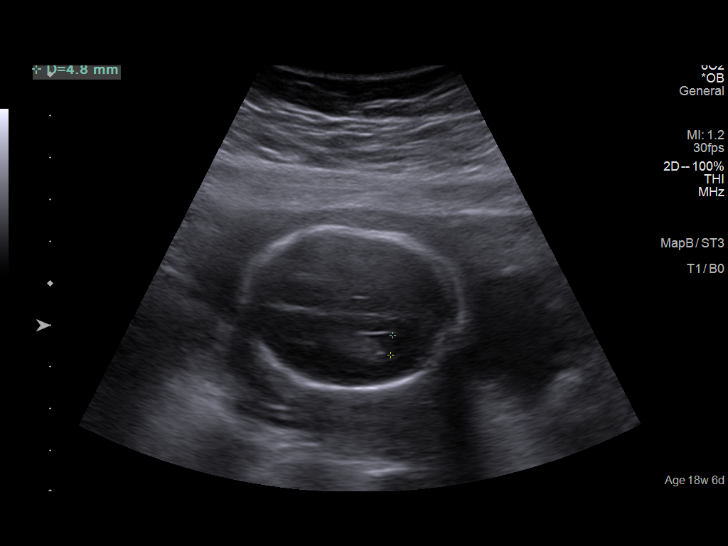
[im 28/93]
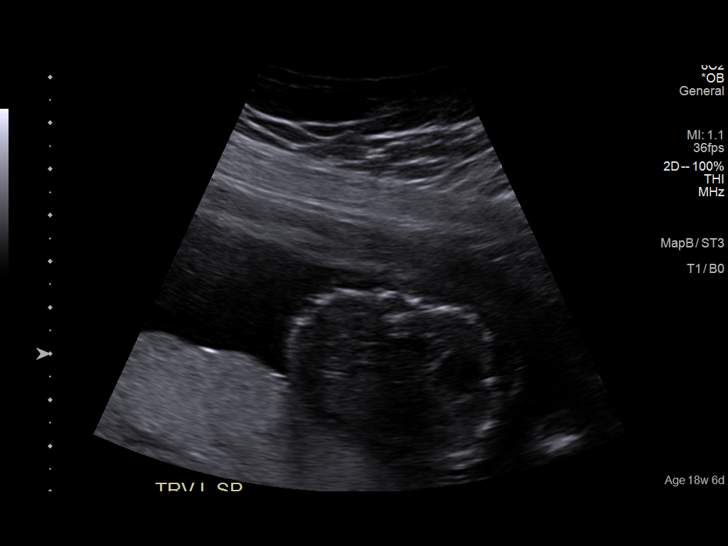
[im 35/93]
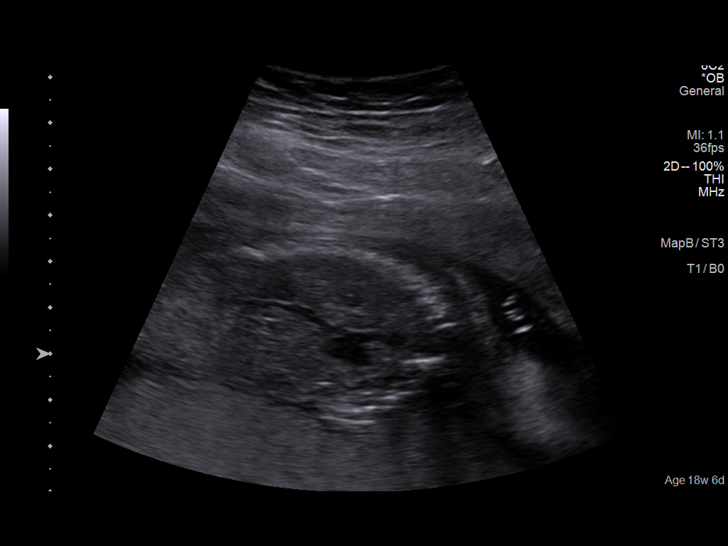
[im 41/93]
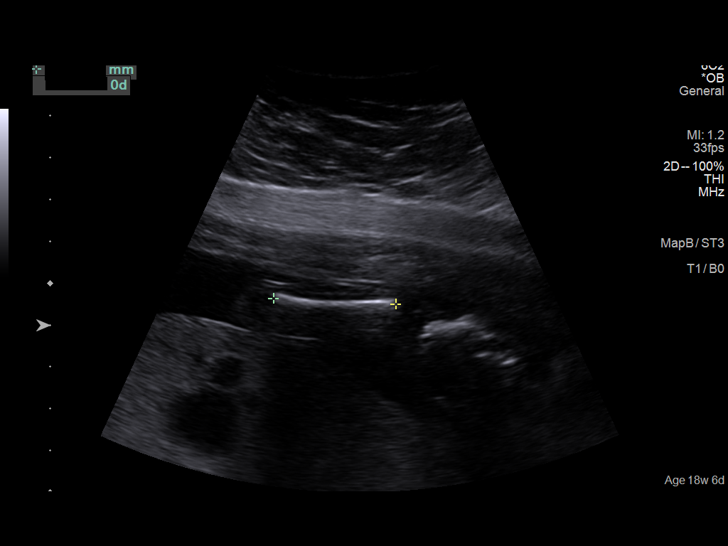
[im 52/93]
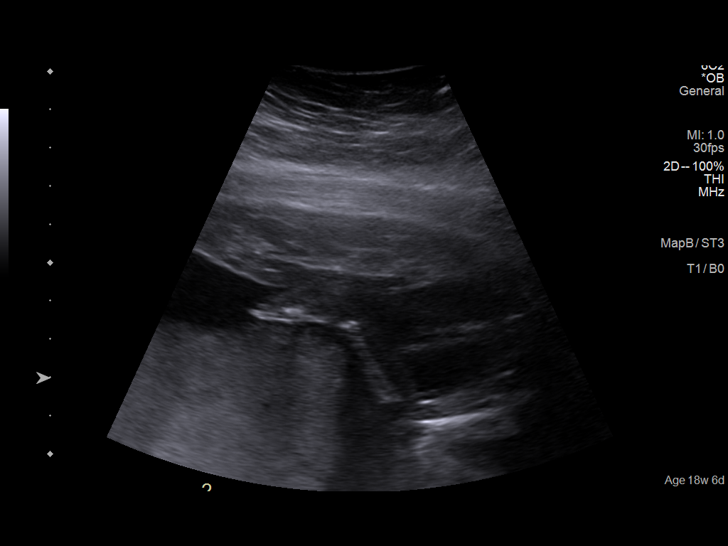
[im 58/93]
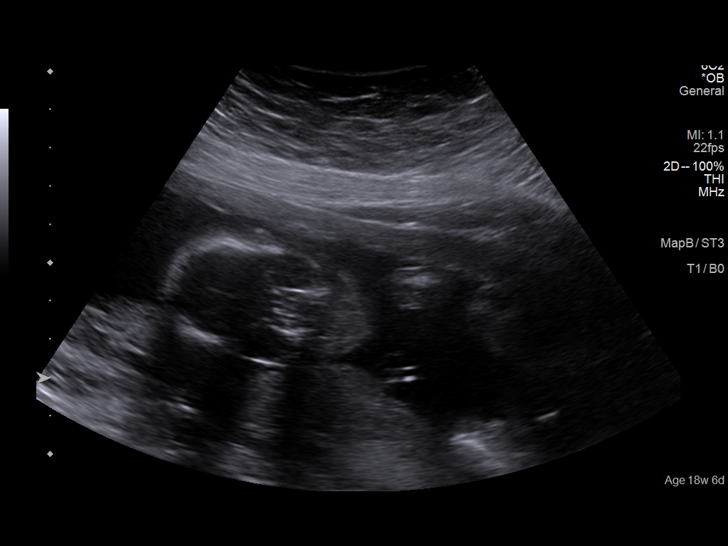
[im 65/93]
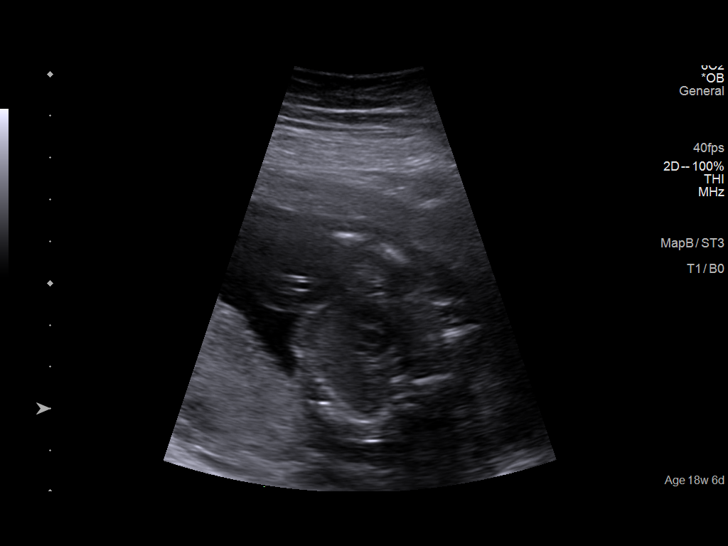
[im 75/93]
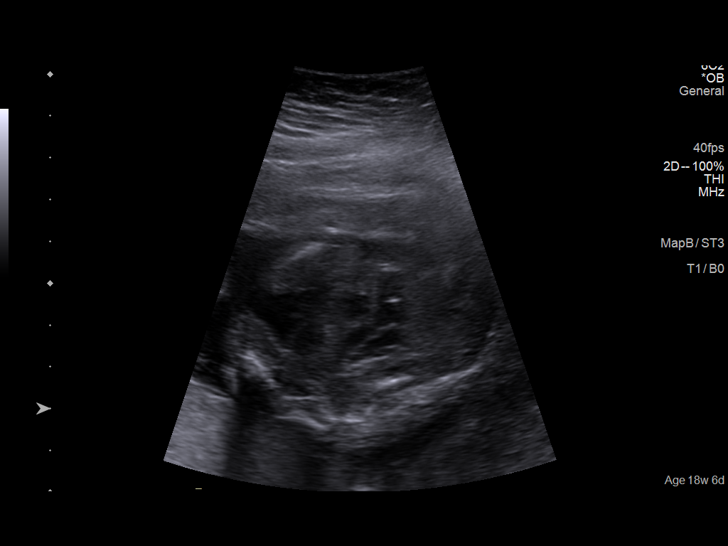
[im 82/93]
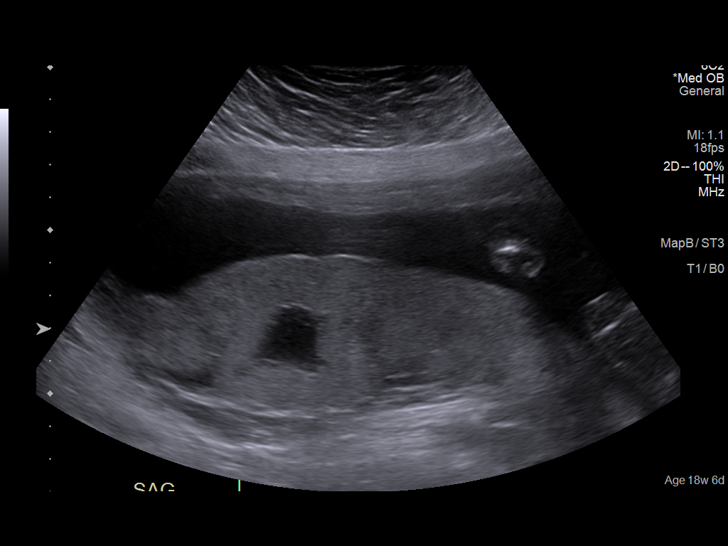
[im 89/93]
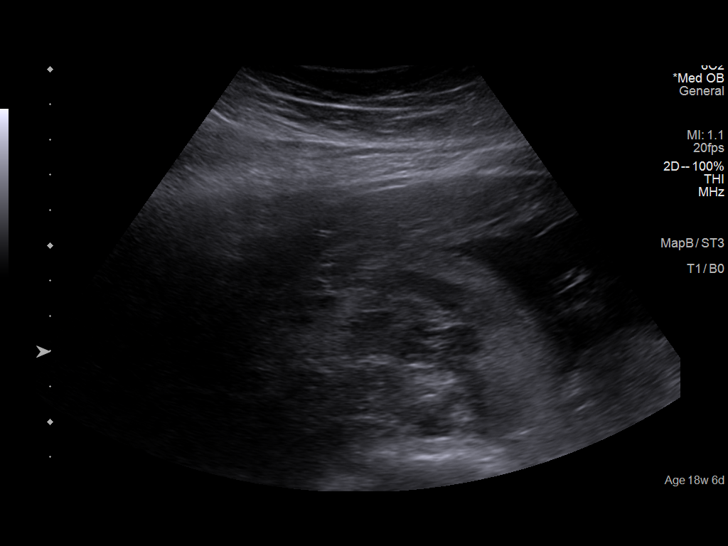

[12 of 28 positions shown; findings below may reference images not displayed]

IMPRESSION: Dear    STAPLES,

 Thank you for referring your patient for a fetal anatomical
 survey.

 There is a singleton gestation with subjectively normal amniotic
 fluid volume.
 Patient is at 18w 6d based on earliest ultrasound performed at
 [facility]] Perinatal at 13w 6d on 12/308080
 The fetal biometry correlates with established dating.

 Routine  evaluation of the fetal anatomy was performed.  The
 fetal anatomical survey appears within normal limits within the
 resolution of ultrasound as described above.
  Incidental note of a  central placental lake was noted without
 turbulent flow .

 Patient had normal cell free DNA screening.

 Thank you for allowing us to participate in your patient's care.

 assistance.

## 2021-11-15 IMAGING — CT CT ABD-PELV W/ CM
3 of 5 series · 17 of 46 positions shown, 19 images · IV contrast (APPLIED)
Comparison: None.

CLINICAL DATA: Four days postpartum. Lower abdominal pain with
discharge.

EXAM:
CT ABDOMEN AND PELVIS WITH CONTRAST
TECHNIQUE: Multidetector CT imaging of the abdomen and pelvis was performed
using the standard protocol following bolus administration of
intravenous contrast.
CONTRAST:  100mL OMNIPAQUE IOHEXOL 300 MG/ML  SOLN

[Series 2: routine abd/pel with · axial · 0.78mm/px · z∈[-429,-49]mm · 12 of 90 slices shown, 14 images]
[im 7/90  soft-tissue]
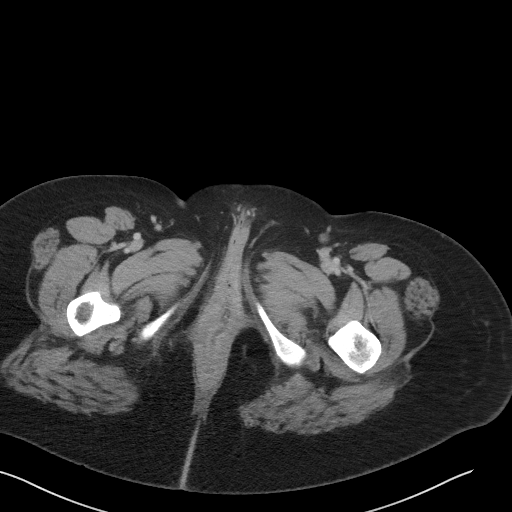
[im 7/90  bone]
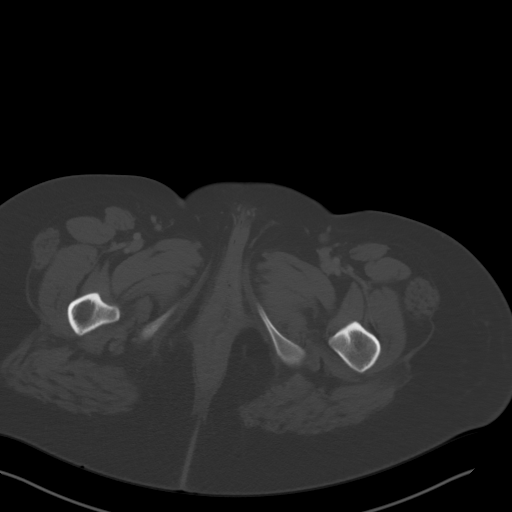
[im 14/90  soft-tissue]
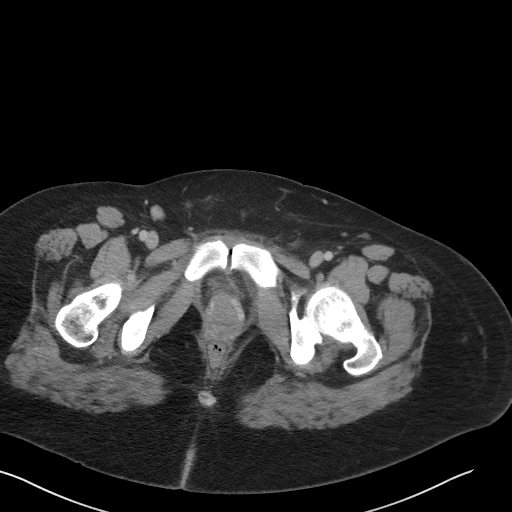
[im 21/90  soft-tissue]
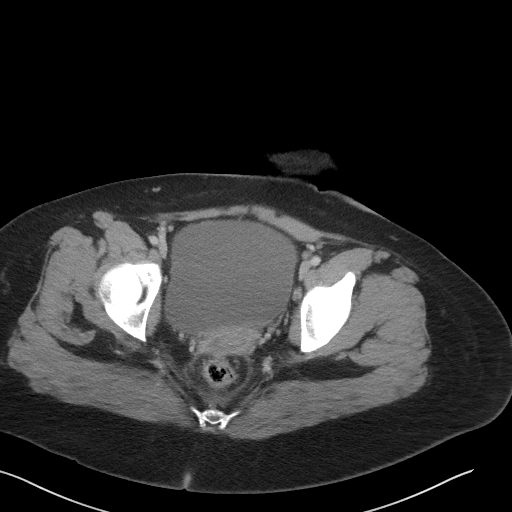
[im 28/90  soft-tissue]
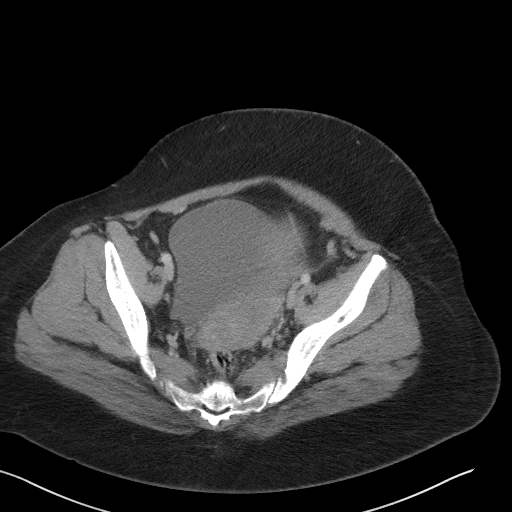
[im 35/90  soft-tissue]
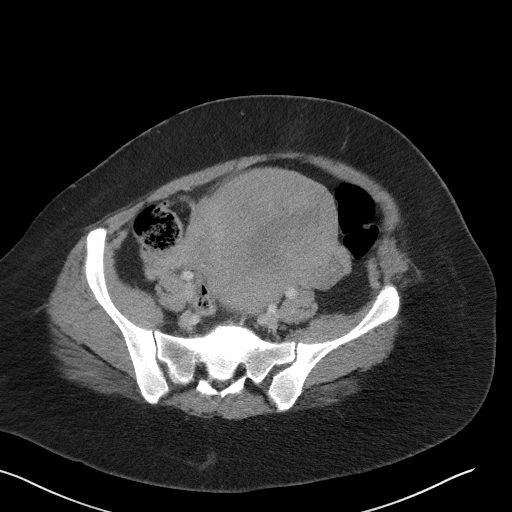
[im 42/90  soft-tissue]
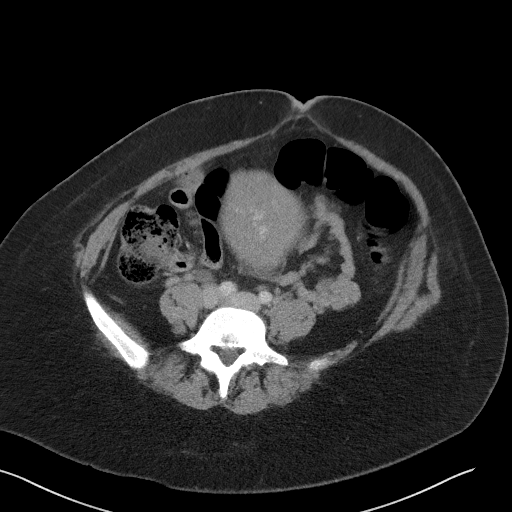
[im 48/90  soft-tissue]
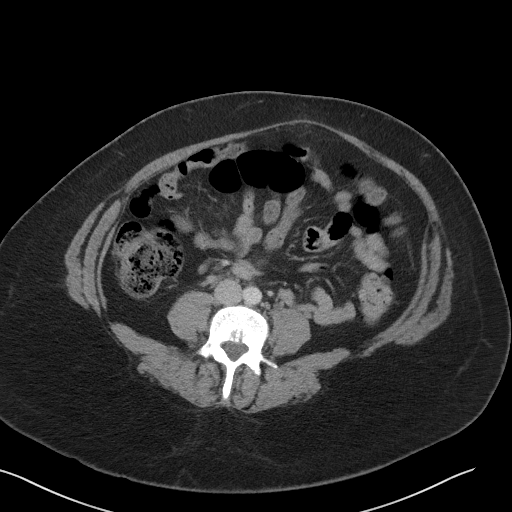
[im 55/90  soft-tissue]
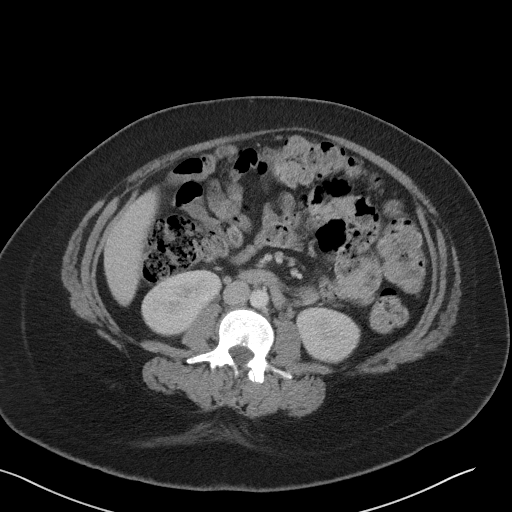
[im 62/90  soft-tissue]
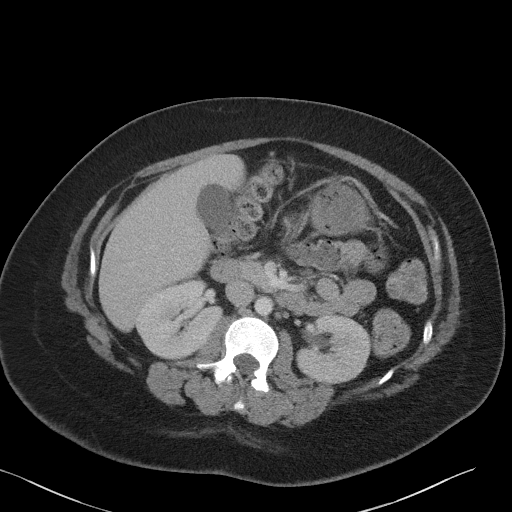
[im 62/90  bone]
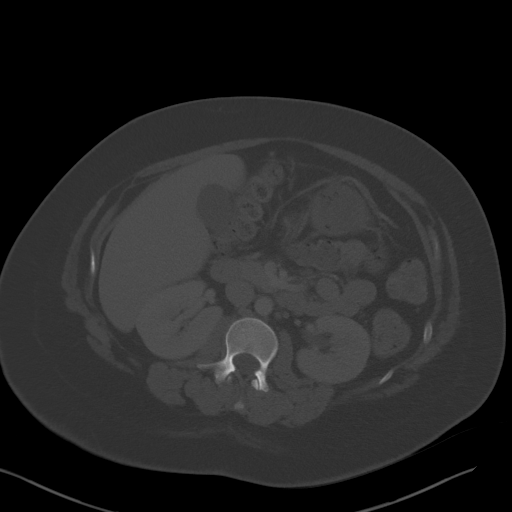
[im 69/90  soft-tissue]
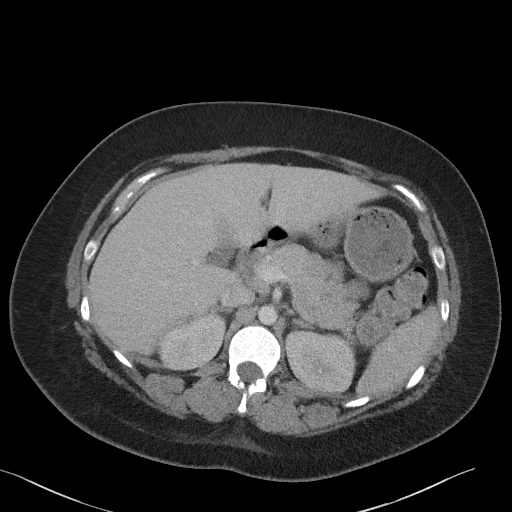
[im 76/90  soft-tissue]
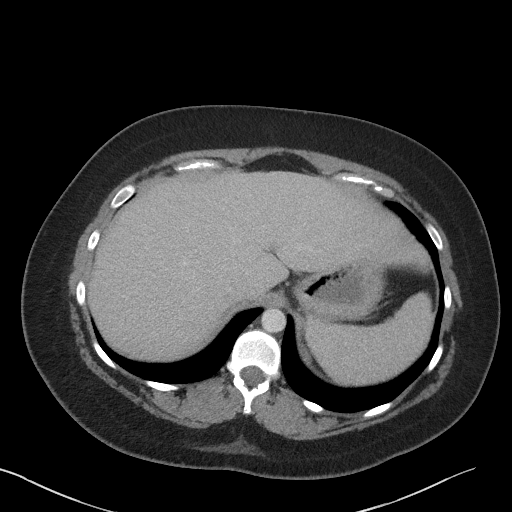
[im 83/90  soft-tissue]
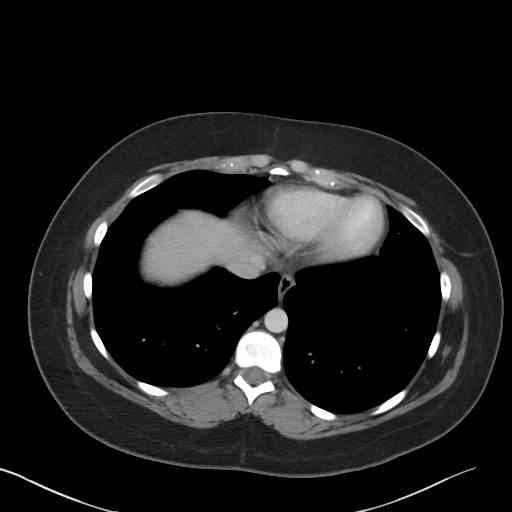

[Series 4: lung · axial · 0.78mm/px · z∈[-129,-89]mm · 2 of 31 slices shown]
[im 8/31  bone]
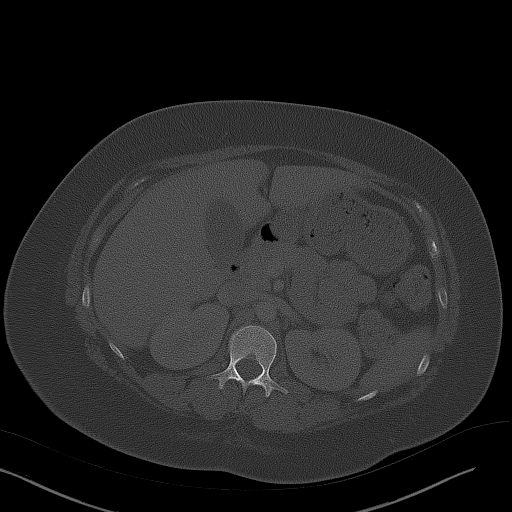
[im 16/31  bone]
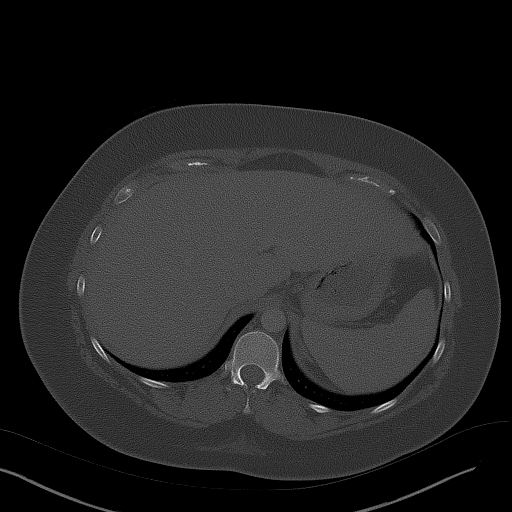

[Series 5: coronal st · coronal · 0.81mm/px · 3 of 95 slices shown]
[im 32/95  soft-tissue]
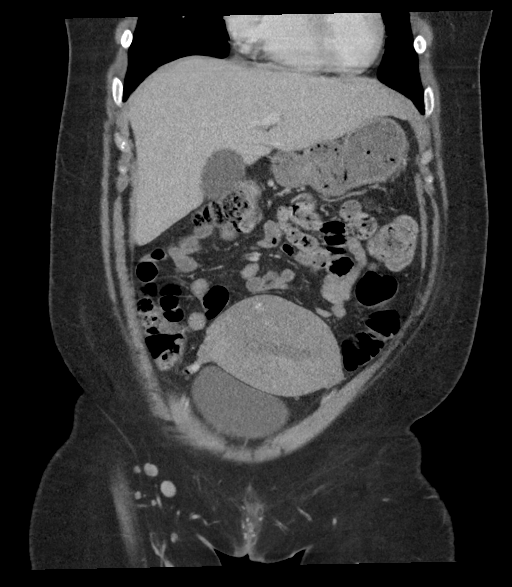
[im 42/95  soft-tissue]
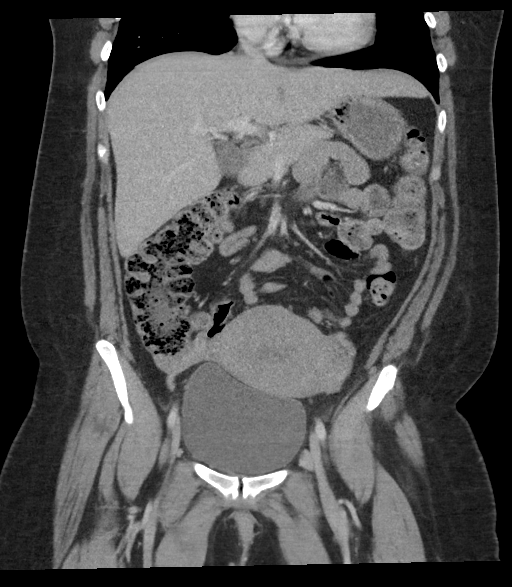
[im 53/95  soft-tissue]
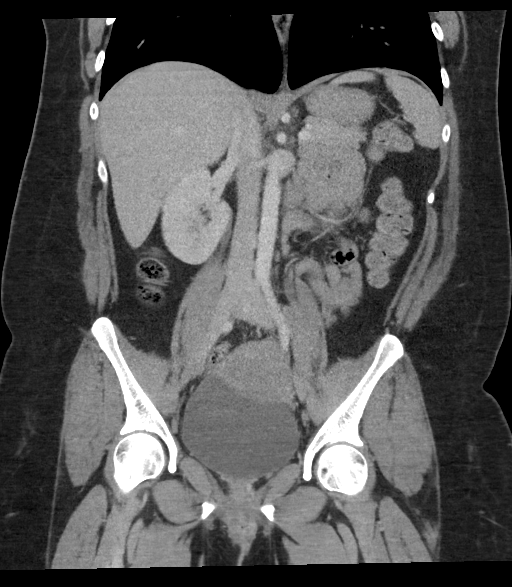

[17 of 46 positions shown; findings below may reference images not displayed]

FINDINGS: LOWER CHEST: Normal.

HEPATOBILIARY: Normal hepatic contours. No intra- or extrahepatic
biliary dilatation. The gallbladder is normal.

PANCREAS: Normal pancreas. No ductal dilatation or peripancreatic
fluid collection.

SPLEEN: Normal.

ADRENALS/URINARY TRACT: The adrenal glands are normal. No
hydronephrosis, nephroureterolithiasis or solid renal mass. The
urinary bladder is normal for degree of distention

STOMACH/BOWEL: There is no hiatal hernia. Normal duodenal course and
caliber. No small bowel dilatation or inflammation. No focal colonic
abnormality. Normal appendix.

VASCULAR/LYMPHATIC: Normal course and caliber of the major abdominal
vessels. No abdominal or pelvic lymphadenopathy.

REPRODUCTIVE: Postpartum uterus. There is no evidence of active
hemorrhage of the vagina.

MUSCULOSKELETAL. No bony spinal canal stenosis or focal osseous
abnormality.

OTHER: None.
IMPRESSION: 1. No acute abnormality of the abdomen or pelvis.
2. Postpartum uterus without evidence of active vaginal hemorrhage.

## 2022-06-13 ENCOUNTER — Other Ambulatory Visit: Payer: Self-pay

## 2022-06-13 ENCOUNTER — Encounter: Payer: Self-pay | Admitting: Medical Oncology

## 2022-06-13 ENCOUNTER — Emergency Department
Admission: EM | Admit: 2022-06-13 | Discharge: 2022-06-13 | Disposition: A | Discharge status: Home / Self Care | Payer: Medicaid Other | Attending: Emergency Medicine | Admitting: Emergency Medicine

## 2022-06-13 DIAGNOSIS — U071 COVID-19: Secondary | ICD-10-CM | POA: Insufficient documentation

## 2022-06-13 DIAGNOSIS — R519 Headache, unspecified: Secondary | ICD-10-CM | POA: Diagnosis present

## 2022-06-13 LAB — SARS CORONAVIRUS 2 BY RT PCR: SARS Coronavirus 2 by RT PCR: POSITIVE — AB

## 2022-06-13 MED ORDER — ACETAMINOPHEN 500 MG PO TABS
1000.0000 mg | ORAL_TABLET | Freq: Once | ORAL | Status: AC
  Administered 2022-06-13: 1000 mg via ORAL
  Filled 2022-06-13: qty 2

## 2022-06-13 NOTE — ED Notes (Signed)
See triage note  Presents with body aches and sore throat yesterday

## 2022-06-13 NOTE — ED Triage Notes (Signed)
Pt reports Covid like sx's that began yesterday.

## 2022-06-13 NOTE — Discharge Instructions (Addendum)
Call your PCP if any continued problems or concerns.  Return to the emergency department immediately should you develop any shortness of breath or difficulty breathing.  Drink lots of fluids to stay hydrated.  You may take Tylenol or ibuprofen for headache, fever, body aches.  A prescription for Tessalon Perles was sent to the pharmacy if you need this for cough which may or may not help with your COVID cough.

## 2022-06-13 NOTE — ED Provider Notes (Signed)
[facility] Provider Note    Event Date/Time   First MD Initiated Contact with Patient 06/13/22 1403     (approximate)   History   Generalized Body Aches, Headache, and URI   HPI  Jasmin Zimmerman is a 21 y.o. female presents to the ED with complaint of upper respiratory symptoms and fever.  Mother states that child and the child's father got sick over the weekend as well as herself.  Currently she denies any nausea, vomiting or diarrhea.  Patient did get the COVID-vaccine.     Physical Exam   Triage Vital Signs: ED Triage Vitals  Enc Vitals Group     BP 06/13/22 1418 122/76     Pulse Rate 06/13/22 1418 (!) 116     Resp --      Temp 06/13/22 1418 (!) 102.4 F (39.1 C)     Temp Source 06/13/22 1418 Oral     SpO2 06/13/22 1418 98 %     Weight 06/13/22 1336 218 lb 4.1 oz (99 kg)     Height 06/13/22 1336 5\' 1"  (1.549 m)     Head Circumference --      Peak Flow --      Pain Score 06/13/22 1336 0     Pain Loc --      Pain Edu? --      Excl. in GC? --     Most recent vital signs: Vitals:   06/13/22 1418 06/13/22 1553  BP: 122/76 120/70  Pulse: (!) 116 100  Resp:  20  Temp: (!) 102.4 F (39.1 C) (!) 100.9 F (38.3 C)  SpO2: 98% 98%     General: Awake, no distress.  CV:  Good peripheral perfusion.  Heart regular rate and rhythm. Resp:  Normal effort.  Lungs are clear bilaterally. Abd:  No distention.  Other:     ED Results / Procedures / Treatments   Labs (all labs ordered are listed, but only abnormal results are displayed) Labs Reviewed  SARS CORONAVIRUS 2 BY RT PCR - Abnormal; Notable for the following components:      Result Value   SARS Coronavirus 2 by RT PCR POSITIVE (*)    All other components within normal limits      PROCEDURES:  Critical Care performed:   Procedures   MEDICATIONS ORDERED IN ED: Medications  acetaminophen (TYLENOL) tablet 1,000 mg (1,000 mg Oral Given 06/13/22 1519)     IMPRESSION / MDM  / ASSESSMENT AND PLAN / ED COURSE  I reviewed the triage vital signs and the nursing notes.   Differential diagnosis includes, but is not limited to, viral URI, influenza, COVID.  21 year old female presents to the ED with complaint of upper respiratory symptoms that began over the weekend along with other family members.  Patient has had COVID-vaccine.  We discussed quarantine for 5 days from her date of symptoms as she has been vaccinated.  She is encouraged to increase fluids to stay hydrated and also the use of Tylenol or ibuprofen as needed for fever.  She is to follow-up with her PCP by phone if any continued problems and strongly encouraged to return to the emergency department should she develop any shortness of breath or difficulty breathing.      Patient's presentation is most consistent with acute complicated illness / injury requiring diagnostic workup.  FINAL CLINICAL IMPRESSION(S) / ED DIAGNOSES   Final diagnoses:  COVID     Rx / DC Orders  ED Discharge Orders     None        Note:  This document was prepared using Dragon voice recognition software and may include unintentional dictation errors.   Tommi Rumps, PA-C 06/13/22 1614    Merwyn Katos, MD 06/14/22 [phone]

## 2023-01-08 ENCOUNTER — Ambulatory Visit: Payer: Self-pay

## 2023-01-08 ENCOUNTER — Encounter: Payer: Self-pay | Admitting: Oncology

## 2023-06-01 ENCOUNTER — Other Ambulatory Visit: Payer: Self-pay

## 2023-06-01 ENCOUNTER — Emergency Department
Admission: EM | Admit: 2023-06-01 | Discharge: 2023-06-01 | Disposition: A | Discharge status: Home / Self Care | Payer: Medicaid Other | Attending: Emergency Medicine | Admitting: Emergency Medicine

## 2023-06-01 ENCOUNTER — Emergency Department: Payer: Medicaid Other

## 2023-06-01 ENCOUNTER — Encounter: Payer: Self-pay | Admitting: Oncology

## 2023-06-01 DIAGNOSIS — Z3A01 Less than 8 weeks gestation of pregnancy: Secondary | ICD-10-CM | POA: Insufficient documentation

## 2023-06-01 DIAGNOSIS — O2631 Retained intrauterine contraceptive device in pregnancy, first trimester: Secondary | ICD-10-CM | POA: Diagnosis present

## 2023-06-01 DIAGNOSIS — T8331XA Breakdown (mechanical) of intrauterine contraceptive device, initial encounter: Secondary | ICD-10-CM

## 2023-06-01 DIAGNOSIS — O26851 Spotting complicating pregnancy, first trimester: Secondary | ICD-10-CM | POA: Diagnosis not present

## 2023-06-01 DIAGNOSIS — Z331 Pregnant state, incidental: Secondary | ICD-10-CM

## 2023-06-01 DIAGNOSIS — O209 Hemorrhage in early pregnancy, unspecified: Secondary | ICD-10-CM

## 2023-06-01 LAB — CBC WITH DIFFERENTIAL/PLATELET
Abs Immature Granulocytes: 0.02 10*3/uL (ref 0.00–0.07)
Basophils Absolute: 0 10*3/uL (ref 0.0–0.1)
Basophils Relative: 0 %
Eosinophils Absolute: 0 10*3/uL (ref 0.0–0.5)
Eosinophils Relative: 0 %
HCT: 36.6 % (ref 36.0–46.0)
Hemoglobin: 11.4 g/dL — ABNORMAL LOW (ref 12.0–15.0)
Immature Granulocytes: 0 %
Lymphocytes Relative: 25 %
Lymphs Abs: 1.8 10*3/uL (ref 0.7–4.0)
MCH: 22.9 pg — ABNORMAL LOW (ref 26.0–34.0)
MCHC: 31.1 g/dL (ref 30.0–36.0)
MCV: 73.6 fL — ABNORMAL LOW (ref 80.0–100.0)
Monocytes Absolute: 0.5 10*3/uL (ref 0.1–1.0)
Monocytes Relative: 6 %
Neutro Abs: 5 10*3/uL (ref 1.7–7.7)
Neutrophils Relative %: 69 %
Platelets: 394 10*3/uL (ref 150–400)
RBC: 4.97 MIL/uL (ref 3.87–5.11)
RDW: 16.2 % — ABNORMAL HIGH (ref 11.5–15.5)
WBC: 7.3 10*3/uL (ref 4.0–10.5)
nRBC: 0 % (ref 0.0–0.2)

## 2023-06-01 LAB — COMPREHENSIVE METABOLIC PANEL
ALT: 22 U/L (ref 0–44)
AST: 20 U/L (ref 15–41)
Albumin: 4 g/dL (ref 3.5–5.0)
Alkaline Phosphatase: 72 U/L (ref 38–126)
Anion gap: 9 (ref 5–15)
BUN: 9 mg/dL (ref 6–20)
CO2: 25 mmol/L (ref 22–32)
Calcium: 9.1 mg/dL (ref 8.9–10.3)
Chloride: 102 mmol/L (ref 98–111)
Creatinine, Ser: 0.66 mg/dL (ref 0.44–1.00)
GFR, Estimated: >60 mL/min (ref >60)
Glucose, Bld: 101 mg/dL — ABNORMAL HIGH (ref 70–99)
Potassium: 4.5 mmol/L (ref 3.5–5.1)
Sodium: 136 mmol/L (ref 135–145)
Total Bilirubin: 0.6 mg/dL (ref 0.3–1.2)
Total Protein: 7.8 g/dL (ref 6.5–8.1)

## 2023-06-01 LAB — HCG, QUANTITATIVE, PREGNANCY: hCG, Beta Chain, Quant, S: 46181 m[IU]/mL — ABNORMAL HIGH (ref <5)

## 2023-06-01 LAB — POC URINE PREG, ED: Preg Test, Ur: POSITIVE — AB

## 2023-06-01 MED ORDER — ACETAMINOPHEN 325 MG PO TABS
650.0000 mg | ORAL_TABLET | Freq: Once | ORAL | Status: AC
  Administered 2023-06-01: 650 mg via ORAL
  Filled 2023-06-01: qty 2

## 2023-06-01 NOTE — ED Triage Notes (Signed)
Pt presents to ED with c/o of vag bleeding and possible pregnancy.  NAD Noted. Pt states spotting when wiping and minor cramping.

## 2023-06-01 NOTE — ED Provider Notes (Signed)
[facility] Provider Note    Event Date/Time   First MD Initiated Contact with Patient 06/01/23 [phone]     (approximate)   History   Vaginal Bleeding   HPI  VANIDA CHAVOLLA is a 22 y.o. female with PMH of biliary colic and anemia in pregnancy, presents for evaluation of vaginal bleeding and positive pregnancy test.  Patient states she took a home pregnancy test which was positive, she called her doctor because she has an IUD and they advised her to come to the ED. Patient has a paragard copper IUD that was placed 01/24/21. Today patient reports that she has had some vaginal bleeding for about 2 weeks.  She describes it as light and more of like a spotting.  She does endorse occasional pelvic pain, describes it as cramping.  She states that her IUD was placed about 3 years ago and her partner has recently felt the strings.     Physical Exam   Triage Vital Signs: ED Triage Vitals  Encounter Vitals Group     BP 06/01/23 0927 116/87     Systolic BP Percentile --      Diastolic BP Percentile --      Pulse Rate 06/01/23 0927 85     Resp 06/01/23 0927 16     Temp --      Temp src --      SpO2 06/01/23 0927 100 %     Weight 06/01/23 0928 218 lb 4.1 oz (99 kg)     Height 06/01/23 0928 5\' 1"  (1.549 m)     Head Circumference --      Peak Flow --      Pain Score 06/01/23 0928 3     Pain Loc --      Pain Education --      Exclude from Growth Chart --     Most recent vital signs: Vitals:   06/01/23 0927  BP: 116/87  Pulse: 85  Resp: 16  SpO2: 100%    General: Awake, mild distress. CV:  Good peripheral perfusion.  Resp:  Normal effort.  Abd:  No distention.  Tender to palpation suprapubically. Pelvic:  Small amount of blood within the vaginal vault, IUD strings are visible at the cervix.   ED Results / Procedures / Treatments   Labs (all labs ordered are listed, but only abnormal results are displayed) Labs Reviewed  CBC WITH  DIFFERENTIAL/PLATELET - Abnormal; Notable for the following components:      Result Value   Hemoglobin 11.4 (*)    MCV 73.6 (*)    MCH 22.9 (*)    RDW 16.2 (*)    All other components within normal limits  HCG, QUANTITATIVE, PREGNANCY - Abnormal; Notable for the following components:   hCG, Beta Chain, Quant, S 46,181 (*)    All other components within normal limits  COMPREHENSIVE METABOLIC PANEL - Abnormal; Notable for the following components:   Glucose, Bld 101 (*)    All other components within normal limits  POC URINE PREG, ED - Abnormal; Notable for the following components:   Preg Test, Ur POSITIVE (*)    All other components within normal limits    RADIOLOGY  Pelvic ultrasound obtained, I interpreted the images as well as reviewed the radiologist report.   PROCEDURES:  Critical Care performed: No  Procedures   MEDICATIONS ORDERED IN ED: Medications  acetaminophen (TYLENOL) tablet 650 mg (650 mg Oral Given 06/01/23 1433)  IMPRESSION / MDM / ASSESSMENT AND PLAN / ED COURSE  I reviewed the triage vital signs and the nursing notes.                             22 year old female G1, P1 who has an IUD presents for evaluation of vaginal bleeding in the context of a positive pregnancy test.  Differential diagnosis includes, but is not limited to, intrauterine pregnancy, ectopic pregnancy, miscarriage.  Patient's presentation is most consistent with acute complicated illness / injury requiring diagnostic workup.  Urine pregnancy test was positive, beta-hCG today is 46,181.  CBC notable for mild anemia.  CMP unremarkable.  Pelvic ultrasound obtained to evaluate for IUD placement as well as to rule out ectopic pregnancy.  Images show a single IUP measuring 6 weeks 4 days gestation by crown-rump length. HR is 145 bpm.  There is an adjacent empty gestational sac within the endometrium, IUD in situ.  Right ovarian hemorrhagic cyst measuring up to 2.3 cm.  I spoke with  the patient and updated her on her results.  She does plan on continuing with the pregnancy.   I called the on-call midwife regarding patient's ultrasound results and to discuss IUD removal.  Nurse midwife is agreeable to see the patient to remove the IUD in the ED today with confirmation that the strings are visible.  Pelvic exam completed to confirm.  IUD was successfully removed in the ED today.  Patient was informed of the risks of miscarriage and instructed to follow-up with her OB in about a week for repeat ultrasound.  Patient should return to the ED if she has any increased pelvic pain or bleeding.  Patient can take Tylenol as needed for pain.  She voiced understanding, all questions were answered and she was stable at discharge.   FINAL CLINICAL IMPRESSION(S) / ED DIAGNOSES   Final diagnoses:  Intrauterine device contraceptive failure resulting in pregnancy     Rx / DC Orders   ED Discharge Orders     None        Note:  This document was prepared using Dragon voice recognition software and may include unintentional dictation errors.   Cameron Ali, PA-C 06/01/23 1549    Dionne Bucy, MD 06/01/23 [phone]

## 2023-06-01 NOTE — Consult Note (Signed)
Consult Note  Requesting Attending Physician :  No att. providers found Service Requesting Consult : ED  Assessment/Recommendations:  Jasmin Zimmerman is a 22 y.o. female seen in consultation at the request of Cruz Condon, PA regarding positive pregnancy test with IUD in place.  Active Problems: SIUP with IUD in situ    Plan: IUD removed in ED. F/u in one week for [place]. Routine prenatal care at the [facility].   HPI   Jasmin Zimmerman who presented to the ED for vaginal bleeding abdominal pain. She has had a Paragard IUD in place since approximately 2022 and has felt the strings recently. She had a positive pregnancy test in the ED today and a SIUP confirmed by [place]. She desires to continue to the pregnancy.   Obstetric History:  OB History  Gravida Para Term Preterm AB Living  1 1 1  0 0 1  SAB IAB Ectopic Multiple Live Births  0 0 0 0 1    # Outcome Date GA Lbr Len/2nd Weight Sex Type Anes PTL Lv  1 Term 03/09/20 [redacted]w[redacted]d 20:42 / 01:16 3270 g M Vag-Spont EPI  LIV    Social History: Social History   Socioeconomic History   Marital status: Single    Spouse name: Not on file   Number of children: 1   Years of education: 12   Highest education level: High school graduate  Occupational History   Occupation: unemployed    Comment: Homemaker  Tobacco Use   Smoking status: Never   Smokeless tobacco: Never  Vaping Use   Vaping status: Never Used  Substance and Sexual Activity   Alcohol use: Never   Drug use: Never   Sexual activity: Not Currently    Partners: Male    Birth control/protection: None    Comment: planning pill  Other Topics Concern   Not on file  Social History Narrative   Not on file   Social Determinants of Health   Financial Resource Strain: Low Risk  (09/03/2019)   Overall Financial Resource Strain (CARDIA)    Difficulty of Paying Living Expenses: Not hard at all  Food Insecurity: No Food Insecurity (09/03/2019)   Hunger Vital Sign    Worried  About Running Out of Food in the Last Year: Never true    Ran Out of Food in the Last Year: Never true  Transportation Needs: No Transportation Needs (09/03/2019)   PRAPARE - Administrator, Civil Service (Medical): No    Lack of Transportation (Non-Medical): No  Physical Activity: Not on file  Stress: Not on file  Social Connections: Not on file    Family History: family history includes Cancer in her maternal aunt; Diabetes in her maternal grandfather, maternal grandmother, and maternal uncle; Kidney disease in her maternal grandfather; Vision loss in her maternal grandmother.  Review of Systems: Pertinent items noted in HPI  Objective :  Vital signs in last 24 hours: Pulse Rate:  [85] 85 (08/30 0927) Resp:  [16] 16 (08/30 0927) BP: (116)/(87) 116/87 (08/30 0927) SpO2:  [100 %] 100 % (08/30 0927) Weight:  [99 kg] 99 kg (08/30 0928)  Intake/Output last 3 shifts: No intake/output data recorded.   Physical Exam: Pelvic exam: normal external genitalia, vulva, vagina, cervix, uterus and adnexa, VULVA: normal appearing vulva with no masses, tenderness or lesions, VAGINA: normal appearing vagina with normal color and discharge, no lesions, CERVIX: normal appearing cervix without discharge or lesions, small amount of dark red blood noted; IUD strings  visible at os.   IUD removed by Cruz Condon, PA under my supervision. IUD noted to be intact. Minimal bleeding from cervix. Codee tolerated this procedure well. I reviewed return precautions and advised to make an appointment with the [facility] next week for an [place].   I spent 45 minutes involved in the care of this patient preparing to see the patient by obtaining and reviewing her medical history (including labs, imaging tests and prior procedures), documenting clinical information in the electronic health record (EHR), counseling and coordinating care plans, writing and sending prescriptions, ordering tests or procedures and  in direct communicating with the patient and medical staff discussing pertinent items from her history and physical exam.  Glenetta Borg, CNM  06/01/2023 7:37 PM

## 2023-06-01 NOTE — ED Notes (Signed)
See triage note  Presents with some vaginal bleeding /spotting  Denies any clots Noticed blood when wiping

## 2023-06-01 NOTE — Discharge Instructions (Addendum)
Your ultrasound showed that you are 6 weeks and 4 days pregnant.  Please follow-up with your OB in about a week for repeat ultrasound.  You are at increased risk of miscarriage due to the IUD.  Return to the ED if you have any increase in bleeding or pain.  You can take 650 mg of Tylenol every 6 hours as needed for pain.  Your next dose for Tylenol is at 8:30 PM tonight.

## 2023-06-05 ENCOUNTER — Emergency Department: Payer: Medicaid Other

## 2023-06-05 ENCOUNTER — Encounter: Payer: Self-pay | Admitting: Emergency Medicine

## 2023-06-05 ENCOUNTER — Other Ambulatory Visit: Payer: Self-pay

## 2023-06-05 ENCOUNTER — Emergency Department
Admission: EM | Admit: 2023-06-05 | Discharge: 2023-06-05 | Disposition: A | Discharge status: Home / Self Care | Payer: Medicaid Other | Attending: Emergency Medicine | Admitting: Emergency Medicine

## 2023-06-05 DIAGNOSIS — O200 Threatened abortion: Secondary | ICD-10-CM | POA: Insufficient documentation

## 2023-06-05 DIAGNOSIS — Z3A01 Less than 8 weeks gestation of pregnancy: Secondary | ICD-10-CM | POA: Insufficient documentation

## 2023-06-05 DIAGNOSIS — O209 Hemorrhage in early pregnancy, unspecified: Secondary | ICD-10-CM | POA: Diagnosis present

## 2023-06-05 LAB — HCG, QUANTITATIVE, PREGNANCY: hCG, Beta Chain, Quant, S: 83074 m[IU]/mL — ABNORMAL HIGH (ref <5)

## 2023-06-05 LAB — BASIC METABOLIC PANEL
Anion gap: 10 (ref 5–15)
BUN: 10 mg/dL (ref 6–20)
CO2: 22 mmol/L (ref 22–32)
Calcium: 8.9 mg/dL (ref 8.9–10.3)
Chloride: 103 mmol/L (ref 98–111)
Creatinine, Ser: 0.64 mg/dL (ref 0.44–1.00)
GFR, Estimated: >60 mL/min (ref >60)
Glucose, Bld: 128 mg/dL — ABNORMAL HIGH (ref 70–99)
Potassium: 4.2 mmol/L (ref 3.5–5.1)
Sodium: 135 mmol/L (ref 135–145)

## 2023-06-05 LAB — CBC
HCT: 34.7 % — ABNORMAL LOW (ref 36.0–46.0)
Hemoglobin: 10.9 g/dL — ABNORMAL LOW (ref 12.0–15.0)
MCH: 22.6 pg — ABNORMAL LOW (ref 26.0–34.0)
MCHC: 31.4 g/dL (ref 30.0–36.0)
MCV: 72 fL — ABNORMAL LOW (ref 80.0–100.0)
Platelets: 399 10*3/uL (ref 150–400)
RBC: 4.82 MIL/uL (ref 3.87–5.11)
RDW: 16.3 % — ABNORMAL HIGH (ref 11.5–15.5)
WBC: 7.6 10*3/uL (ref 4.0–10.5)
nRBC: 0 % (ref 0.0–0.2)

## 2023-06-05 LAB — ABO/RH: ABO/RH(D): O POS

## 2023-06-05 NOTE — Discharge Instructions (Signed)
As we discussed please call the number provided for OB/GYN to arrange a follow-up appointment as soon as possible.  We would recommend repeating the ultrasound in approximately 7 days.  Please follow-up with OB/GYN sooner if you have significant increase in bleeding or cramping.  Return to the emergency department for any symptom concerning to yourself.

## 2023-06-05 NOTE — ED Notes (Signed)
See triage note  Presents with some vaginal bleeding and slight abd  pain  States she is approx [redacted] weeks pregnant

## 2023-06-05 NOTE — ED Provider Notes (Signed)
[facility] Provider Note    Event Date/Time   First MD Initiated Contact with Patient 06/05/23 1506     (approximate)  History   Chief Complaint: Vaginal Bleeding  HPI  Jasmin Zimmerman is a 22 y.o. female G2, P1 with a past medical history of anemia who presents to the emergency department for vaginal bleeding.  According to the patient she is approximately [redacted] weeks pregnant, became pregnant while having an IUD in place.  Her OB/GYN remove the IUD last week and told her she would have some mild vaginal bleeding.  Patient states she has had some mild vaginal bleeding/spotting however today she began having larger clots within her bleeding she became concerned so she came to the emergency department.  Patient denies any abdominal pain but does state a pressure sensation at times across the lower abdomen.  Denies any fever cough congestion, no chest pain.  Physical Exam   Triage Vital Signs: ED Triage Vitals [06/05/23 1440]  Encounter Vitals Group     BP (!) 121/51     Systolic BP Percentile      Diastolic BP Percentile      Pulse Rate 78     Resp 18     Temp 98.4 F (36.9 C)     Temp Source Oral     SpO2 99 %     Weight 218 lb 4.1 oz (99 kg)     Height 5\' 2"  (1.575 m)     Head Circumference      Peak Flow      Pain Score 6     Pain Loc      Pain Education      Exclude from Growth Chart     Most recent vital signs: Vitals:   06/05/23 1440  BP: (!) 121/51  Pulse: 78  Resp: 18  Temp: 98.4 F (36.9 C)  SpO2: 99%    General: Awake, no distress.  CV:  Good peripheral perfusion.  Regular rate and rhythm  Resp:  Normal effort.  Equal breath sounds bilaterally.  Abd:  No distention.  Soft, nontender.  No rebound or guarding.  ED Results / Procedures / Treatments    MEDICATIONS ORDERED IN ED: Medications - No data to display   IMPRESSION / MDM / ASSESSMENT AND PLAN / ED COURSE  I reviewed the triage vital signs and the nursing  notes.  Patient's presentation is most consistent with acute presentation with potential threat to life or bodily function.  Patient presents the emergency department for vaginal bleeding approximately [redacted] weeks pregnant had an IUD removed last week.  Patient's lab work shows reassuring CBC hemoglobin of 10.9, reassuring chemistry and ABO Rh of O+ not requiring RhoGAM.  We will obtain a beta quantitative hCG as well as an ultrasound to further evaluate.  Differential would include subchorionic hemorrhage, miscarriage, expected bleeding from IUD removal/cervical trauma.  Patient's ultrasound shows likely twin pregnancy but only a single live intrauterine pregnancy measuring 7 weeks and 0 days, given this finding they do recommend repeat ultrasound in 7 days for recheck/reevaluation.  Slightly smaller crown-rump length could be indicative of miscarriage versus technique.  Second gestational sac is empty.  I discussed these findings with the patient recommend she follow-up with OB as soon as possible for repeat ultrasound in approximately 7 days.  FINAL CLINICAL IMPRESSION(S) / ED DIAGNOSES   Threatened miscarriage   Note:  This document was prepared using Dragon voice recognition software and may  include unintentional dictation errors.   Minna Antis, MD 06/05/23 [phone]

## 2023-06-05 NOTE — ED Triage Notes (Signed)
Patient to ED via POV for vaginal bleeding. Pt reports 1 episode yesterday and 1 today. Pt states it is bright red, blood clot. States she did get her IUD removed last week. Approx [redacted] weeks pregnant.

## 2023-06-07 ENCOUNTER — Encounter: Payer: Self-pay | Admitting: Physician Assistant

## 2023-06-07 ENCOUNTER — Ambulatory Visit: Payer: Medicaid Other | Admitting: Physician Assistant

## 2023-06-07 VITALS — BP 102/66 | HR 67 | Temp 97.6°F | Wt 218.2 lb

## 2023-06-07 DIAGNOSIS — O99211 Obesity complicating pregnancy, first trimester: Secondary | ICD-10-CM

## 2023-06-07 DIAGNOSIS — O0991 Supervision of high risk pregnancy, unspecified, first trimester: Secondary | ICD-10-CM

## 2023-06-07 DIAGNOSIS — Z23 Encounter for immunization: Secondary | ICD-10-CM

## 2023-06-07 DIAGNOSIS — O0990 Supervision of high risk pregnancy, unspecified, unspecified trimester: Secondary | ICD-10-CM | POA: Insufficient documentation

## 2023-06-07 DIAGNOSIS — O99011 Anemia complicating pregnancy, first trimester: Secondary | ICD-10-CM

## 2023-06-07 DIAGNOSIS — O209 Hemorrhage in early pregnancy, unspecified: Secondary | ICD-10-CM

## 2023-06-07 DIAGNOSIS — Z3A01 Less than 8 weeks gestation of pregnancy: Secondary | ICD-10-CM

## 2023-06-07 DIAGNOSIS — O99019 Anemia complicating pregnancy, unspecified trimester: Secondary | ICD-10-CM | POA: Insufficient documentation

## 2023-06-07 DIAGNOSIS — O99210 Obesity complicating pregnancy, unspecified trimester: Secondary | ICD-10-CM | POA: Insufficient documentation

## 2023-06-07 DIAGNOSIS — Z3480 Encounter for supervision of other normal pregnancy, unspecified trimester: Secondary | ICD-10-CM | POA: Insufficient documentation

## 2023-06-07 HISTORY — DX: Hemorrhage in early pregnancy, unspecified: O20.9

## 2023-06-07 HISTORY — DX: Supervision of high risk pregnancy, unspecified, unspecified trimester: O09.90

## 2023-06-07 MED ORDER — PREPLUS 27-1 MG PO TABS
1.0000 | ORAL_TABLET | Freq: Every day | ORAL | 13 refills | Status: AC
Start: 2023-06-07 — End: ?

## 2023-06-07 MED ORDER — IRON (FERROUS SULFATE) 325 (65 FE) MG PO TABS
1.0000 | ORAL_TABLET | Freq: Every day | ORAL | Status: DC
Start: 2023-06-07 — End: 2023-06-07

## 2023-06-07 MED ORDER — IRON (FERROUS SULFATE) 325 (65 FE) MG PO TABS
1.0000 | ORAL_TABLET | Freq: Every day | ORAL | Status: AC
Start: 2023-06-07 — End: ?

## 2023-06-07 NOTE — Progress Notes (Addendum)
Presents for initiation of prenatal care. [facility] ED evaluation 06/01/23 and 06/05/23 for vaginal bleeding with US done at each evaluation. Client reports heavier bleeding with clots yesterday (dark red / black in color) like a period and pressure feelings, but no pain feelings. States today bleeding is only pink spotting. Per consult with A. Streilein PA-C, continue with appt. Per client, was born in [place] with no hx of international travel. Desires MaternitT-21 at appropriate EGA and noted on pink sticky. Hgb = 10.9 at 06/05/23 [facility] ED evaluation so anemia profile ordered today. Verified client name, DOB, demographics and contact name / number. Jossie Ng, RN [place] initiated per standing orders due to hgb = 10.9 on 06/05/23. Jossie Ng, RN Due to high risk code, [facility] [place] cancelled by A. Streilein PA-C and ordered thru [facility]. Call to scheduler, who spoke with tech and Dr. Judeth Cornfield and MFM will not do [place] as less than 10 weeks. Info from prior US shared with scheduler who then shared with above. ARobbie Lis PA-C notified and following call to [facility] [place] who states will do needed [place], order changed. Call to scheduler and [place] appt scheduled for 06/13/23 at 1300 at the [facility]. Client to arrive at 1245 with a full bladder. Call to client and given [place] appt with instructions. Jossie Ng, RN

## 2023-06-07 NOTE — Progress Notes (Signed)
[facility] Department  Maternal Health Clinic   INITIAL PRENATAL VISIT NOTE  Subjective:  Jasmin Zimmerman is a 22 y.o. G2P1001 at [redacted]w[redacted]d being seen today to start prenatal care at the [facility] Department.  She is currently monitored for the following issues for this high-risk pregnancy and has History of biliary colic; Anemia affecting pregnancy in third trimester; Postpartum endometritis; Obesity BMI=41.6; Supervision of high-risk pregnancy, unspecified trimester; Bleeding in early pregnancy; Obesity affecting pregnancy, antepartum, pre-preg BMI 36.6; and Anemia affecting pregnancy, antepartum on their problem list.  Patient reports  nausea, light bright red spotting .  Contractions: Not present. Vag. Bleeding: Small.  Movement: Absent. Denies leaking of fluid.   Indications for ASA therapy (per uptodate) One of the following: Previous pregnancy with preeclampsia, especially early onset and with an adverse outcome No Multifetal gestation No Chronic hypertension No Type 1 or 2 diabetes mellitus No Chronic kidney disease No Autoimmune disease (antiphospholipid syndrome, systemic lupus erythematosus) No  Two or more of the following: Nulliparity No Obesity (body mass index >30 kg/m2) Yes Family history of preeclampsia in mother or sister No Age ?35 years No Sociodemographic characteristics (African American race, low socioeconomic level) Yes Personal risk factors (eg, previous pregnancy with low birth weight or small for gestational age infant, previous adverse pregnancy outcome [eg, stillbirth], interval >10 years between pregnancies) No  The following portions of the patient's history were reviewed and updated as appropriate: allergies, current medications, past family history, past medical history, past social history, past surgical history and problem list. Problem list updated.  Objective:   Vitals:   06/07/23 1415  BP: 102/66  Pulse: 67  Temp: 97.6 F  (36.4 C)  Weight: 218 lb 3.2 oz (99 kg)    Fetal Status: Fetal Heart Rate (bpm):  (not heard) Fundal Height:  (unsure due to habitus, but below SP) Movement: Absent      Physical Exam Vitals and nursing note reviewed.  Constitutional:      General: She is not in acute distress.    Appearance: Normal appearance. She is well-developed. She is obese.  HENT:     Head: Normocephalic and atraumatic.     Right Ear: Tympanic membrane and external ear normal.     Left Ear: Tympanic membrane and external ear normal.     Nose: Nose normal. No congestion or rhinorrhea.     Mouth/Throat:     Lips: Pink.     Mouth: Mucous membranes are moist.     Dentition: Normal dentition. No dental caries.     Pharynx: Oropharynx is clear. Uvula midline. No oropharyngeal exudate or posterior oropharyngeal erythema.     Comments: Dentition: dentition intact, no obvious decay Eyes:     General: No scleral icterus.    Conjunctiva/sclera: Conjunctivae normal.  Neck:     Thyroid: No thyroid mass or thyromegaly.  Cardiovascular:     Rate and Rhythm: Normal rate and regular rhythm.     Pulses: Normal pulses.     Heart sounds: Normal heart sounds.     Comments: Extremities are warm and well perfused Pulmonary:     Effort: Pulmonary effort is normal.     Breath sounds: Normal breath sounds.  Chest:     Chest wall: No mass.  Breasts:    Tanner Score is 5.     Breasts are symmetrical.     Right: Normal. No mass, nipple discharge or skin change.     Left: Normal. No mass, nipple  discharge or skin change.  Abdominal:     General: Abdomen is protuberant.     Palpations: Abdomen is soft. There is no mass.     Tenderness: There is no abdominal tenderness. There is no rebound.  Genitourinary:    General: Normal vulva.     Exam position: Lithotomy position.     Pubic Area: No rash or pubic lice.      Labia:        Right: No rash or lesion.        Left: No rash or lesion.      Vagina: Bleeding present. No  vaginal discharge, erythema or lesions.     Cervix: No cervical motion tenderness, discharge, friability, lesion or erythema.     Uterus: Normal. Enlarged (Gravid 6-8 wk size). Not tender.      Adnexa: Right adnexa normal and left adnexa normal.     Rectum: Normal. No external hemorrhoid.     Comments: pH = not tested due to presence of sm amt of bright red blood in vag vault Musculoskeletal:     Right lower leg: No edema.     Left lower leg: No edema.  Lymphadenopathy:     Head:     Right side of head: No preauricular or posterior auricular adenopathy.     Left side of head: No preauricular or posterior auricular adenopathy.     Cervical: No cervical adenopathy.     Upper Body:     Right upper body: No supraclavicular, axillary or epitrochlear adenopathy.     Left upper body: No supraclavicular, axillary or epitrochlear adenopathy.     Lower Body: No right inguinal adenopathy. No left inguinal adenopathy.  Skin:    General: Skin is warm and dry.     Capillary Refill: Capillary refill takes less than 2 seconds.     Findings: No rash.  Neurological:     Mental Status: She is alert and oriented to person, place, and time.    Assessment and Plan:  Pregnancy: G2P1001 at [redacted]w[redacted]d  1. Supervision of high-risk pregnancy, unspecified trimester First Pap today. - Prenatal Profile I - Pap IG (Image Guided) - Prenatal Vit-Fe Fumarate-FA (PREPLUS) 27-1 MG TABS; Take 1 tablet by mouth daily.  Dispense: 30 tablet; Refill: 13  2. [redacted] weeks gestation of pregnancy RV in 4 weeks.  3. Bleeding in early pregnancy Reviewed prior ultrasounds showing IUP 2 sac preg with 1 empty sac at [redacted]w[redacted]d on 06/01/23, then single sac IUP with FHR on 06/05/23 (and increasing quant BhCG level). No apparent ongoing twin pregnancy, just singleton, per most recent US 2 days ago. Sched viability [place] in 5 days (ideally). Counseled re: uncertainty of pregnancy viability with first trim bleeding, and possibility of miscarriage. No  available action to prevent this. Pt states understanding. - US OB COMPLETE LESS THAN 14 WEEKS; Future  4. Obesity affecting pregnancy, antepartum, unspecified obesity type Pregravid BMI 36.6. Baseline labs today. Counseled optimal preg wt gain 11-20 lb, recommend daily low-dose aspirin starting at 12wk and continuing til delivery and also MNT referral. Pt agreeable. - Glucose, 1 hour - Hgb A1c w/o eAG - TSH - Protein / creatinine ratio, urine - Amb ref to Medical Nutrition Therapy-MNT  5. Anemia affecting pregnancy, antepartum Hgb 10.9 g/dL per hospital labs. Anemia panel today and treat anemia per S.O. with oral iron. - Fe+CBC/D/Plt+TIBC+Fer+Retic  Discussed overview of care and coordination with inpatient delivery practices including [facility],  [facility].  Preterm labor symptoms and general obstetric precautions including but not limited to vaginal bleeding, contractions, leaking of fluid and fetal movement were reviewed in detail with the patient.  Please refer to After Visit Summary for other counseling recommendations.   Return in about 4 weeks (around 07/05/2023) for Routine prenatal care.  No future appointments.  Landry Dyke, PA-C

## 2023-06-08 LAB — PROTEIN / CREATININE RATIO, URINE
Creatinine, Urine: 284.2 mg/dL
Protein, Ur: 26.9 mg/dL
Protein/Creat Ratio: 95 mg/g{creat} (ref 0–200)

## 2023-06-10 LAB — PREGNANCY, INITIAL SCREEN
Antibody Screen: NEGATIVE
Basophils Absolute: 0 10*3/uL (ref 0.0–0.2)
Basos: 0 %
Bilirubin, UA: NEGATIVE
Chlamydia trachomatis, NAA: POSITIVE — AB
EOS (ABSOLUTE): 0 10*3/uL (ref 0.0–0.4)
Eos: 0 %
Glucose, UA: NEGATIVE
HCV Ab: NONREACTIVE
HIV Screen 4th Generation wRfx: NONREACTIVE
Hematocrit: 33.5 % — ABNORMAL LOW (ref 34.0–46.6)
Hemoglobin: 10.9 g/dL — ABNORMAL LOW (ref 11.1–15.9)
Hepatitis B Surface Ag: NEGATIVE
Immature Grans (Abs): 0 10*3/uL (ref 0.0–0.1)
Immature Granulocytes: 0 %
Lymphocytes Absolute: 1.7 10*3/uL (ref 0.7–3.1)
Lymphs: 21 %
MCH: 23.3 pg — ABNORMAL LOW (ref 26.6–33.0)
MCHC: 32.5 g/dL (ref 31.5–35.7)
MCV: 72 fL — ABNORMAL LOW (ref 79–97)
Monocytes Absolute: 0.3 10*3/uL (ref 0.1–0.9)
Monocytes: 4 %
Neisseria Gonorrhoeae by PCR: NEGATIVE
Neutrophils Absolute: 6.2 10*3/uL (ref 1.4–7.0)
Neutrophils: 75 %
Nitrite, UA: NEGATIVE
Platelets: 416 10*3/uL (ref 150–450)
RBC: 4.68 x10E6/uL (ref 3.77–5.28)
RDW: 16.3 % — ABNORMAL HIGH (ref 11.7–15.4)
RPR Ser Ql: NONREACTIVE
Rh Factor: POSITIVE
Rubella Antibodies, IGG: 2.69 {index} (ref >0.99)
Specific Gravity, UA: 1.028 (ref 1.005–1.030)
Urobilinogen, Ur: 0.2 mg/dL (ref 0.2–1.0)
WBC: 8.3 10*3/uL (ref 3.4–10.8)
pH, UA: 6 (ref 5.0–7.5)

## 2023-06-10 LAB — MICROSCOPIC EXAMINATION
Casts: NONE SEEN /LPF
WBC, UA: >30 /HPF — AB (ref 0–5)

## 2023-06-10 LAB — FE+CBC/D/PLT+TIBC+FER+RETIC
Ferritin: 7 ng/mL — ABNORMAL LOW (ref 15–150)
Iron Saturation: 6 % — CL (ref 15–55)
Iron: 24 ug/dL — ABNORMAL LOW (ref 27–159)
Retic Ct Pct: 1.5 % (ref 0.6–2.6)
Total Iron Binding Capacity: 390 ug/dL (ref 250–450)
UIBC: 366 ug/dL (ref 131–425)

## 2023-06-10 LAB — GLUCOSE, 1 HOUR GESTATIONAL: Gestational Diabetes Screen: 132 mg/dL (ref 70–139)

## 2023-06-10 LAB — URINE CULTURE, OB REFLEX

## 2023-06-10 LAB — HGB A1C W/O EAG: Hgb A1c MFr Bld: 5.7 % — ABNORMAL HIGH (ref 4.8–5.6)

## 2023-06-10 LAB — HCV INTERPRETATION

## 2023-06-10 LAB — TSH: TSH: 0.475 u[IU]/mL (ref 0.450–4.500)

## 2023-06-11 ENCOUNTER — Telehealth: Payer: Self-pay

## 2023-06-11 DIAGNOSIS — A749 Chlamydial infection, unspecified: Secondary | ICD-10-CM | POA: Insufficient documentation

## 2023-06-11 LAB — PAP IG (IMAGE GUIDED): PAP Smear Comment: 0

## 2023-06-11 NOTE — Telephone Encounter (Signed)
Return call from client and transferred to clinic by S. Candace Cruise in [facility] Clerical.Client counseled on need for chlamydia treatment and need for partner to have STI appt / treatment. Client scheduled for treatment appt in Nurse Clinic in am. Jossie Ng, RN

## 2023-06-11 NOTE — Telephone Encounter (Signed)
Call to client as needs treatment for chlamydia (positive result on 06/07/23 prenatal profile test result). Left message to call regarding a lab result and need for medicine. Number to call provided. Jossie Ng, RN

## 2023-06-12 ENCOUNTER — Other Ambulatory Visit: Payer: Medicaid Other

## 2023-06-12 DIAGNOSIS — A749 Chlamydial infection, unspecified: Secondary | ICD-10-CM

## 2023-06-12 DIAGNOSIS — O98819 Other maternal infectious and parasitic diseases complicating pregnancy, unspecified trimester: Secondary | ICD-10-CM

## 2023-06-12 MED ORDER — AZITHROMYCIN 500 MG PO TABS
1000.0000 mg | ORAL_TABLET | Freq: Once | ORAL | Status: AC
Start: 2023-06-12 — End: 2023-06-12
  Administered 2023-06-12: 1000 mg via ORAL

## 2023-06-12 NOTE — Progress Notes (Signed)
In nurse clinic for chlamydia treatment. Pt is pregnant. Treated today per SO Dr Lorrin Mais with Azithromycin 1 gram po DOT. Counseled to contact [facility] if vomits within 2 hrs of med. Azithromycin info sheet given and reviewed.  Patient has appt 06/13/23 for U/S and MHC RV 07/05/23, has reminders. Questions answered and reports understanding. Jerel Shepherd, RN

## 2023-06-13 ENCOUNTER — Ambulatory Visit
Admission: RE | Admit: 2023-06-13 | Discharge: 2023-06-13 | Disposition: A | Discharge status: Home / Self Care | Payer: Medicaid Other | Source: Ambulatory Visit | Attending: Physician Assistant | Admitting: Physician Assistant

## 2023-06-13 DIAGNOSIS — O209 Hemorrhage in early pregnancy, unspecified: Secondary | ICD-10-CM | POA: Diagnosis present

## 2023-06-26 ENCOUNTER — Other Ambulatory Visit: Payer: Self-pay

## 2023-06-26 ENCOUNTER — Emergency Department
Admission: EM | Admit: 2023-06-26 | Discharge: 2023-06-27 | Disposition: A | Discharge status: Home / Self Care | Payer: Medicaid Other | Attending: Emergency Medicine | Admitting: Emergency Medicine

## 2023-06-26 DIAGNOSIS — O99891 Other specified diseases and conditions complicating pregnancy: Secondary | ICD-10-CM | POA: Diagnosis not present

## 2023-06-26 DIAGNOSIS — R0602 Shortness of breath: Secondary | ICD-10-CM | POA: Insufficient documentation

## 2023-06-26 DIAGNOSIS — R0789 Other chest pain: Secondary | ICD-10-CM | POA: Insufficient documentation

## 2023-06-26 DIAGNOSIS — Z3A10 10 weeks gestation of pregnancy: Secondary | ICD-10-CM | POA: Insufficient documentation

## 2023-06-26 DIAGNOSIS — O26891 Other specified pregnancy related conditions, first trimester: Secondary | ICD-10-CM | POA: Diagnosis present

## 2023-06-26 LAB — CBC
HCT: 36.1 % (ref 36.0–46.0)
Hemoglobin: 11.5 g/dL — ABNORMAL LOW (ref 12.0–15.0)
MCH: 23.7 pg — ABNORMAL LOW (ref 26.0–34.0)
MCHC: 31.9 g/dL (ref 30.0–36.0)
MCV: 74.3 fL — ABNORMAL LOW (ref 80.0–100.0)
Platelets: 375 10*3/uL (ref 150–400)
RBC: 4.86 MIL/uL (ref 3.87–5.11)
RDW: 15.9 % — ABNORMAL HIGH (ref 11.5–15.5)
WBC: 7 10*3/uL (ref 4.0–10.5)
nRBC: 0 % (ref 0.0–0.2)

## 2023-06-26 LAB — BASIC METABOLIC PANEL
Anion gap: 10 (ref 5–15)
BUN: 8 mg/dL (ref 6–20)
CO2: 22 mmol/L (ref 22–32)
Calcium: 8.8 mg/dL — ABNORMAL LOW (ref 8.9–10.3)
Chloride: 103 mmol/L (ref 98–111)
Creatinine, Ser: 0.55 mg/dL (ref 0.44–1.00)
GFR, Estimated: >60 mL/min (ref >60)
Glucose, Bld: 128 mg/dL — ABNORMAL HIGH (ref 70–99)
Potassium: 3.8 mmol/L (ref 3.5–5.1)
Sodium: 135 mmol/L (ref 135–145)

## 2023-06-26 LAB — TROPONIN I (HIGH SENSITIVITY): Troponin I (High Sensitivity): <2 ng/L (ref <18)

## 2023-06-26 NOTE — ED Provider Notes (Signed)
[facility] Provider Note    Event Date/Time   First MD Initiated Contact with Patient 06/26/23 2325     (approximate)   History   Chief Complaint Chest Pain and Shortness of Breath   HPI  Jasmin Zimmerman is a 22 y.o. female G2P1001 at approximately 10 weeks of pregnancy with no significant past medical history, who presents to the ED complaining of chest pain and shortness of breath.  Patient reports that she has been dealing with intermittent pressure in her chest with some mild difficulty breathing since yesterday evening.  Symptoms can come on at any time, but have seem to affect her more when she is laying down in bed.  She denies any chest pain or shortness of breath currently and has not had any recent fevers or cough.  She has not noticed any pain or swelling in her legs, denies any history of DVT/PE.  She does report that she has felt "mucous" coming up in the back of her throat at times.     Physical Exam   Triage Vital Signs: ED Triage Vitals [06/26/23 2109]  Encounter Vitals Group     BP 121/62     Systolic BP Percentile      Diastolic BP Percentile      Pulse Rate 78     Resp 18     Temp 98.8 F (37.1 C)     Temp Source Oral     SpO2 99 %     Weight 216 lb 0.8 oz (98 kg)     Height 5\' 2"  (1.575 m)     Head Circumference      Peak Flow      Pain Score 0     Pain Loc      Pain Education      Exclude from Growth Chart     Most recent vital signs: Vitals:   06/26/23 2109  BP: 121/62  Pulse: 78  Resp: 18  Temp: 98.8 F (37.1 C)  SpO2: 99%    Constitutional: Alert and oriented. Eyes: Conjunctivae are normal. Head: Atraumatic. Nose: No congestion/rhinnorhea. Mouth/Throat: Mucous membranes are moist.  Cardiovascular: Normal rate, regular rhythm. Grossly normal heart sounds.  2+ radial pulses bilaterally. Respiratory: Normal respiratory effort.  No retractions. Lungs CTAB. Gastrointestinal: Soft and nontender. No  distention. Musculoskeletal: No lower extremity tenderness nor edema.  Neurologic:  Normal speech and language. No gross focal neurologic deficits are appreciated.    ED Results / Procedures / Treatments   Labs (all labs ordered are listed, but only abnormal results are displayed) Labs Reviewed  BASIC METABOLIC PANEL - Abnormal; Notable for the following components:      Result Value   Glucose, Bld 128 (*)    Calcium 8.8 (*)    All other components within normal limits  CBC - Abnormal; Notable for the following components:   Hemoglobin 11.5 (*)    MCV 74.3 (*)    MCH 23.7 (*)    RDW 15.9 (*)    All other components within normal limits  TROPONIN I (HIGH SENSITIVITY)  TROPONIN I (HIGH SENSITIVITY)     EKG  ED ECG REPORT I, Chesley Noon, the attending physician, personally viewed and interpreted this ECG.   Date: 06/26/2023  EKG Time: 21:06  Rate: 85  Rhythm: normal sinus rhythm  Axis: RAD  Intervals:none  ST&T Change: None  PROCEDURES:  Critical Care performed: No  Procedures   MEDICATIONS ORDERED IN ED: Medications -  No data to display   IMPRESSION / MDM / ASSESSMENT AND PLAN / ED COURSE  I reviewed the triage vital signs and the nursing notes.                              22 y.o. female G2P1001 at approximately 10 weeks of pregnancy who presents to the ED complaining of intermittent chest pain and shortness of breath since yesterday, asymptomatic currently.  Patient's presentation is most consistent with acute presentation with potential threat to life or bodily function.  Differential diagnosis includes, but is not limited to, ACS, PE, pneumonia, pneumothorax, musculoskeletal pain, GERD, anxiety, anemia, electrolyte abnormality, AKI.  Patient nontoxic-appearing and in no acute distress, vital signs are unremarkable.  EKG shows no evidence of arrhythmia or ischemia, symptoms seem atypical for ACS and initial troponin within normal limits.  Low  suspicion for PE given patient is currently asymptomatic with reassuring vital signs.  Given her description of mucus in the back of her throat, it seems likely that patient is dealing with GERD.  She is breathing comfortably on room air and lungs are clear to auscultation bilaterally, low suspicion for pneumonia or other pulmonary process and we will hold off on chest x-ray given her pregnancy.  Labs show stable chronic anemia with no significant leukocytosis, lecture abnormality, or AKI.  We will check second set troponin and reassess, observe on cardiac monitor.  {**The patient is on the cardiac monitor to evaluate for evidence of arrhythmia and/or significant heart rate changes.**}      FINAL CLINICAL IMPRESSION(S) / ED DIAGNOSES   Final diagnoses:  None     Rx / DC Orders   ED Discharge Orders     None        Note:  This document was prepared using Dragon voice recognition software and may include unintentional dictation errors.

## 2023-06-26 NOTE — ED Triage Notes (Signed)
Pt presents via POV c/o intermittent chest pressure and SOB. Denies currently. Ambulatory to triage. Denies recent travel. Reports apx 10wks OB.

## 2023-06-27 LAB — TROPONIN I (HIGH SENSITIVITY): Troponin I (High Sensitivity): <2 ng/L (ref <18)

## 2023-07-04 NOTE — Addendum Note (Signed)
Addended by: Heywood Bene on: 07/04/2023 12:56 PM   Modules accepted: Orders

## 2023-07-05 ENCOUNTER — Telehealth: Payer: Self-pay

## 2023-07-05 ENCOUNTER — Ambulatory Visit: Payer: Medicaid Other | Admitting: Physician Assistant

## 2023-07-05 ENCOUNTER — Encounter: Payer: Self-pay | Admitting: Physician Assistant

## 2023-07-05 VITALS — BP 99/66 | HR 71 | Temp 97.9°F | Wt 210.0 lb

## 2023-07-05 DIAGNOSIS — O99019 Anemia complicating pregnancy, unspecified trimester: Secondary | ICD-10-CM

## 2023-07-05 DIAGNOSIS — Z3A11 11 weeks gestation of pregnancy: Secondary | ICD-10-CM

## 2023-07-05 DIAGNOSIS — A749 Chlamydial infection, unspecified: Secondary | ICD-10-CM

## 2023-07-05 DIAGNOSIS — O0990 Supervision of high risk pregnancy, unspecified, unspecified trimester: Secondary | ICD-10-CM

## 2023-07-05 DIAGNOSIS — O99210 Obesity complicating pregnancy, unspecified trimester: Secondary | ICD-10-CM

## 2023-07-05 DIAGNOSIS — O98811 Other maternal infectious and parasitic diseases complicating pregnancy, first trimester: Secondary | ICD-10-CM

## 2023-07-05 DIAGNOSIS — O99211 Obesity complicating pregnancy, first trimester: Secondary | ICD-10-CM

## 2023-07-05 DIAGNOSIS — O219 Vomiting of pregnancy, unspecified: Secondary | ICD-10-CM

## 2023-07-05 DIAGNOSIS — O0991 Supervision of high risk pregnancy, unspecified, first trimester: Secondary | ICD-10-CM

## 2023-07-05 DIAGNOSIS — O99011 Anemia complicating pregnancy, first trimester: Secondary | ICD-10-CM

## 2023-07-05 DIAGNOSIS — O209 Hemorrhage in early pregnancy, unspecified: Secondary | ICD-10-CM

## 2023-07-05 MED ORDER — PROMETHAZINE HCL 25 MG PO TABS: 25.0000 mg | ORAL_TABLET | Freq: Four times a day (QID) | ORAL | 2 refills | Status: DC | PRN

## 2023-07-05 MED ORDER — ASPIRIN 81 MG PO TBEC
81.0000 mg | DELAYED_RELEASE_TABLET | Freq: Every day | ORAL | Status: AC
Start: 2023-07-05 — End: 2023-10-18

## 2023-07-05 NOTE — Progress Notes (Signed)
[facility] Maternal Health Clinic  PRENATAL VISIT NOTE  Subjective:  Jasmin Zimmerman is a 22 y.o. G2P1001 at [redacted]w[redacted]d being seen today for ongoing prenatal care.  She is currently monitored for the following issues for this high-risk pregnancy and has History of biliary colic; Anemia affecting pregnancy in third trimester; Obesity BMI=41.6; Supervision of high-risk pregnancy, unspecified trimester; Bleeding in early pregnancy; Obesity affecting pregnancy, antepartum, pre-preg BMI 36.6; Anemia affecting pregnancy, antepartum; and Chlamydia infection affecting pregnancy, antepartum 06/07/23 on their problem list.  Patient reports nausea and vomiting.  Contractions: Not present. Vag. Bleeding: None.  Movement: Absent. Difficulty keeping food down. Has tried ginger ale. Denies leaking of fluid/ROM.   The following portions of the patient's history were reviewed and updated as appropriate: allergies, current medications, past family history, past medical history, past social history, past surgical history and problem list. Problem list updated.  Objective:   Vitals:   07/05/23 0829  BP: 99/66  Pulse: 71  Temp: 97.9 F (36.6 C)  Weight: 210 lb (95.3 kg)    Fetal Status: Fetal Heart Rate (bpm): 150 Fundal Height: 12 cm Movement: Absent     General:  Alert, oriented and cooperative. Patient is in no acute distress.  Skin: Skin is warm and dry. No rash noted.   Cardiovascular: Normal heart rate noted  Respiratory: Normal respiratory effort, no problems with respiration noted  Abdomen: Soft, gravid, appropriate for gestational age.  Pain/Pressure: Absent     Pelvic: Cervical exam deferred        Extremities: Normal range of motion.  Edema: None  Mental Status: Normal mood and affect. Normal behavior. Normal judgment and thought content.   Assessment and Plan:  Pregnancy: G2P1001 at [redacted]w[redacted]d  1. Supervision of high-risk pregnancy, unspecified trimester Desires fetal  aneuploidy first trim testing today.  - MaterniT21 PLUS Core  2. [redacted] weeks gestation of pregnancy 4 week RV.  3. Chlamydia infection affecting pregnancy, antepartum 06/07/23 TOC today. Plan test for re-infection in 3 mo. - Chlamydia/GC NAA, Confirmation  4. Anemia affecting pregnancy, antepartum Had 11.5 hgb in hosp 06/26/23, so no re-test needed today. Enc to take oral iron if/when tolerated in light of N/V.  5. Bleeding in early pregnancy Resolved. Reviewed 06/13/23 transvag US showing viable IUP with second empty sac.  6. Obesity affecting pregnancy, antepartum, unspecified obesity type 8 lb wt loss, counseled pt re: benefit of low-dose daily aspirin beginning at 12 week til delivery - pt desires to start then. - aspirin EC 81 MG tablet; Take 1 tablet (81 mg total) by mouth daily. Swallow whole.  7. Nausea and vomiting during pregnancy Enc non-pharm measures such as Sea-Bands. Pt desires addition of pharm measure - eRx promethazine to pharmacy of choice. Pt to alert [place] if sx worsen or not improving. - promethazine (PHENERGAN) 25 MG tablet; Take 1 tablet (25 mg total) by mouth every 6 (six) hours as needed for nausea or vomiting.  Dispense: 30 tablet; Refill: 2   Preterm labor symptoms and general obstetric precautions including but not limited to vaginal bleeding, contractions, leaking of fluid and fetal movement were reviewed in detail with the patient. Please refer to After Visit Summary for other counseling recommendations.  Return in about 4 weeks (around 08/02/2023) for Routine prenatal care.  No future appointments.  Landry Dyke, PA-C

## 2023-07-05 NOTE — Telephone Encounter (Signed)
Call transferred to clinic by Myrle Sheng and client states since chlamydia test of cure this am in clinic she has had burning with urination and very small amt of bright pink blood on tissue after voiding x1. Consult with A. Streilein PA-C and client counseled most likely burning and small amt blood on tissue is from irritation due to swab used in testing this am. Client counseled to call clinic tomorrow if still continues to have burning and counseled to see ED evaluation if should develop large amt bleeding, especially if bright red. Client verbalized understanding. Jossie Ng, RN

## 2023-07-14 LAB — CHLAMYDIA/GC NAA, CONFIRMATION
Chlamydia trachomatis, NAA: POSITIVE — AB
Neisseria gonorrhoeae, NAA: NEGATIVE

## 2023-07-14 LAB — C. TRACHOMATIS NAA, CONFIRM: C. trachomatis NAA, Confirm: POSITIVE — AB

## 2023-07-16 ENCOUNTER — Telehealth: Payer: Self-pay

## 2023-07-16 NOTE — Telephone Encounter (Signed)
Patient notified of Chlamydia result. Scheduled for treatment 07/16/2023 a.m. BTHIELE RN

## 2023-07-17 ENCOUNTER — Ambulatory Visit: Payer: Medicaid Other | Admitting: Advanced Practice Midwife

## 2023-07-17 DIAGNOSIS — O99011 Anemia complicating pregnancy, first trimester: Secondary | ICD-10-CM

## 2023-07-17 DIAGNOSIS — O0991 Supervision of high risk pregnancy, unspecified, first trimester: Secondary | ICD-10-CM

## 2023-07-17 DIAGNOSIS — O98819 Other maternal infectious and parasitic diseases complicating pregnancy, unspecified trimester: Secondary | ICD-10-CM

## 2023-07-17 DIAGNOSIS — O99210 Obesity complicating pregnancy, unspecified trimester: Secondary | ICD-10-CM

## 2023-07-17 DIAGNOSIS — O0990 Supervision of high risk pregnancy, unspecified, unspecified trimester: Secondary | ICD-10-CM

## 2023-07-17 DIAGNOSIS — O99013 Anemia complicating pregnancy, third trimester: Secondary | ICD-10-CM

## 2023-07-17 DIAGNOSIS — O99211 Obesity complicating pregnancy, first trimester: Secondary | ICD-10-CM

## 2023-07-17 DIAGNOSIS — A749 Chlamydial infection, unspecified: Secondary | ICD-10-CM

## 2023-07-17 LAB — MATERNIT 21 PLUS CORE, BLOOD

## 2023-07-17 MED ORDER — AZITHROMYCIN 500 MG PO TABS
1000.0000 mg | ORAL_TABLET | Freq: Once | ORAL | Status: AC
Start: 2023-07-17 — End: 2023-07-17
  Administered 2023-07-17: 1000 mg via ORAL

## 2023-07-17 NOTE — Progress Notes (Signed)
Pt is here for treatment of chlamydia. 1G Azithromycin given po, instructed to call if vomiting occurs within 2 hours. Pt instructed to eat a meal after visit.  Contact card given, condoms declined. Gaspar Garbe, RN

## 2023-08-02 ENCOUNTER — Ambulatory Visit: Payer: Medicaid Other

## 2023-08-08 ENCOUNTER — Ambulatory Visit: Payer: Medicaid Other | Admitting: Family Medicine

## 2023-08-08 VITALS — BP 114/63 | HR 87 | Temp 99.5°F | Wt 211.8 lb

## 2023-08-08 DIAGNOSIS — Z3482 Encounter for supervision of other normal pregnancy, second trimester: Secondary | ICD-10-CM

## 2023-08-08 DIAGNOSIS — Z3A16 16 weeks gestation of pregnancy: Secondary | ICD-10-CM

## 2023-08-08 DIAGNOSIS — O99212 Obesity complicating pregnancy, second trimester: Secondary | ICD-10-CM

## 2023-08-08 DIAGNOSIS — O98812 Other maternal infectious and parasitic diseases complicating pregnancy, second trimester: Secondary | ICD-10-CM

## 2023-08-08 DIAGNOSIS — O99210 Obesity complicating pregnancy, unspecified trimester: Secondary | ICD-10-CM

## 2023-08-08 DIAGNOSIS — O99019 Anemia complicating pregnancy, unspecified trimester: Secondary | ICD-10-CM

## 2023-08-08 DIAGNOSIS — Z3480 Encounter for supervision of other normal pregnancy, unspecified trimester: Secondary | ICD-10-CM

## 2023-08-08 DIAGNOSIS — A749 Chlamydial infection, unspecified: Secondary | ICD-10-CM

## 2023-08-08 DIAGNOSIS — O99012 Anemia complicating pregnancy, second trimester: Secondary | ICD-10-CM

## 2023-08-08 LAB — HEMOGLOBIN, FINGERSTICK: Hemoglobin: 11.1 g/dL (ref 11.1–15.9)

## 2023-08-08 NOTE — Progress Notes (Deleted)
MaterniT-21 repeated today as due to weather issue when drawn 07/05/23, specimen was too old to test when reached lab in [place]. Desires AFP today also.

## 2023-08-08 NOTE — Progress Notes (Signed)
[facility]  PRENATAL VISIT NOTE  Subjective:  Jasmin Zimmerman is a 22 y.o. G2P1001 at [redacted]w[redacted]d being seen today for ongoing prenatal care.  She is currently monitored for the following issues for this high-risk pregnancy and has History of biliary colic; Anemia, antepartum; Supervision of other normal pregnancy, antepartum; Bleeding in early pregnancy; Obesity affecting pregnancy, antepartum, pre-preg BMI 36.6; and Chlamydia infection affecting pregnancy, antepartum 06/07/23 on their problem list.  Patient reports  insomnia .  Contractions: Not present. Vag. Bleeding: None.  Movement: Present. Denies leaking of fluid/ROM.   The following portions of the patient's history were reviewed and updated as appropriate: allergies, current medications, past family history, past medical history, past social history, past surgical history and problem list. Problem list updated.  Objective:   Vitals:   08/08/23 1337  BP: 114/63  Pulse: 87  Temp: 99.5 F (37.5 C)  Weight: 211 lb 12.8 oz (96.1 kg)    Fetal Status: Fetal Heart Rate (bpm): 158 Fundal Height: 14 cm Movement: Present     General:  Alert, oriented and cooperative. Patient is in no acute distress.  Skin: Skin is warm and dry. No rash noted.   Cardiovascular: Normal heart rate noted  Respiratory: Normal respiratory effort, no problems with respiration noted  Abdomen: Soft, gravid, appropriate for gestational age.  Pain/Pressure: Absent     Pelvic: Cervical exam deferred        Extremities: Normal range of motion.  Edema: None  Mental Status: Normal mood and affect. Normal behavior. Normal judgment and thought content.   Assessment and Plan:  Pregnancy: G2P1001 at [redacted]w[redacted]d  1. Supervision of high-risk pregnancy, unspecified trimester -taking PNV daily -ordered anatomy scan today  -pt reports occasionally feeling dizzy or weak- she states that she drinks a coke and feel better -reports she  drinks lots of water but only is eating 1-2 x a day, reports she doesn't feel hungry often -counseled on smaller meals more often to stabilize BS levels, encouraged to increase protein- reviewed protein sources -Hgb check today for anemia eval  2. Chlamydia infection affecting pregnancy, antepartum 06/07/23 -TOC today  - AFP, Serum, Open Spina Bifida - MaterniT 21 plus Core, Blood - Chlamydia/GC NAA, Confirmation  3. Obesity affecting pregnancy, antepartum, unspecified obesity type -was prescribed ASA and reports that it tasted bad and she wasn't able to tolerate it -partner recently bough orange flavored ASA -strongly encouraged patient to take, as she is at higher risk of pre-E  4. Anemia, antepartum -has not been taking iron, last Hgb= 11.5 on 9/24, repeat today -reports she's been trying to increase her iron levels with food  5. [redacted] weeks gestation of pregnancy    Preterm labor symptoms and general obstetric precautions including but not limited to vaginal bleeding, contractions, leaking of fluid and fetal movement were reviewed in detail with the patient. Please refer to After Visit Summary for other counseling recommendations.  Return in about 4 weeks (around 09/05/2023) for Routine Prenatal Care.  No future appointments.  Lenice Llamas, [place]

## 2023-08-08 NOTE — Progress Notes (Addendum)
Taking aspirin intermittently due to taste. States recently purchased orange flavored aspirin. Counseled to take in applesauce, pudding, ice cream, etc to help mask taste. Currently not taking iron tablet. Desires AFP today. MaterniT-21 redrawn today (specimen arrived too late for testing to [place] facility early October due to weather related issues). Remains undecided as to post-partum BCM - verified has pamphlet. 3 week chlamydia TOC due today.Jossie Ng, RN Hgb = 11.1 and reviewed by Aliene Altes FNP-C. Jossie Ng, RN

## 2023-08-12 LAB — MATERNIT 21 PLUS CORE, BLOOD
Fetal Fraction: 13
Result (T21): NEGATIVE
Trisomy 13 (Patau syndrome): NEGATIVE
Trisomy 18 (Edwards syndrome): NEGATIVE
Trisomy 21 (Down syndrome): NEGATIVE

## 2023-08-12 LAB — AFP, SERUM, OPEN SPINA BIFIDA
AFP MoM: 2.43
AFP Value: 66.6 ng/mL
Gest. Age on Collection Date: 16.2 wk
Maternal Age At EDD: 24.5 a
OSBR Risk 1 IN: 329
Test Results:: NEGATIVE
Weight: 212 [lb_av]

## 2023-08-13 LAB — CHLAMYDIA/GC NAA, CONFIRMATION
Chlamydia trachomatis, NAA: NEGATIVE
Neisseria gonorrhoeae, NAA: NEGATIVE

## 2023-08-28 ENCOUNTER — Encounter: Payer: Self-pay | Admitting: Oncology

## 2023-09-04 NOTE — Progress Notes (Unsigned)
[facility]  PRENATAL VISIT NOTE  Subjective:  Jasmin Zimmerman is a 22 y.o. G2P1001 at [redacted]w[redacted]d being seen today for ongoing prenatal care.  She is currently monitored for the following issues for this low-risk pregnancy and has History of biliary colic; Anemia, antepartum; Supervision of other normal pregnancy, antepartum; Bleeding in early pregnancy; Obesity affecting pregnancy, antepartum, pre-preg BMI 36.6; and Chlamydia infection affecting pregnancy, antepartum 06/07/23 on their problem list.  Patient reports {sx:[id number]}.   .  .   . ***Denies leaking of fluid/ROM.   The following portions of the patient's history were reviewed and updated as appropriate: allergies, current medications, past family history, past medical history, past social history, past surgical history and problem list. Problem list updated.  Objective:  There were no vitals filed for this visit.  Fetal Status:           General:  Alert, oriented and cooperative. Patient is in no acute distress.  Skin: Skin is warm and dry. No rash noted.   Cardiovascular: Normal heart rate noted  Respiratory: Normal respiratory effort, no problems with respiration noted  Abdomen: Soft, gravid, appropriate for gestational age.        Pelvic: Cervical exam deferred        Extremities: Normal range of motion.     Mental Status: Normal mood and affect. Normal behavior. Normal judgment and thought content.   Assessment and Plan:  Pregnancy: G2P1001 at [redacted]w[redacted]d  1. Supervision of other normal pregnancy, antepartum ***  2. Anemia, antepartum -previously not taking iron -last Hgb done on 08/08/23 = 11.1 -encouraged iron rich foods, counseled to take iron EOD  3. Obesity affecting pregnancy, antepartum, unspecified obesity type -stopped taking ASA for a while due to intolerance -did she start taking again?  4. [redacted] weeks gestation of pregnancy -anatomy scan was ordered on 08/08/23   {Blank  single:19197::"Term","Preterm"} labor symptoms and general obstetric precautions including but not limited to vaginal bleeding, contractions, leaking of fluid and fetal movement were reviewed in detail with the patient. Please refer to After Visit Summary for other counseling recommendations.  No follow-ups on file.  Future Appointments  Date Time Provider Department Center  09/05/2023  4:00 PM [facility] PROVIDER [facility] None    Lenice Llamas, FNP

## 2023-09-05 ENCOUNTER — Ambulatory Visit: Payer: Medicaid Other | Admitting: Family Medicine

## 2023-09-05 VITALS — BP 110/63 | HR 86 | Temp 97.9°F | Wt 213.6 lb

## 2023-09-05 DIAGNOSIS — O99210 Obesity complicating pregnancy, unspecified trimester: Secondary | ICD-10-CM

## 2023-09-05 DIAGNOSIS — Z3A20 20 weeks gestation of pregnancy: Secondary | ICD-10-CM

## 2023-09-05 DIAGNOSIS — O99212 Obesity complicating pregnancy, second trimester: Secondary | ICD-10-CM

## 2023-09-05 DIAGNOSIS — O99012 Anemia complicating pregnancy, second trimester: Secondary | ICD-10-CM

## 2023-09-05 DIAGNOSIS — O99019 Anemia complicating pregnancy, unspecified trimester: Secondary | ICD-10-CM

## 2023-09-05 DIAGNOSIS — Z3482 Encounter for supervision of other normal pregnancy, second trimester: Secondary | ICD-10-CM

## 2023-09-05 DIAGNOSIS — Z3480 Encounter for supervision of other normal pregnancy, unspecified trimester: Secondary | ICD-10-CM

## 2023-09-05 NOTE — Progress Notes (Signed)
Patient here for MH RV at 20 2/7. Still considering BCM. Needs U/S ordered, to be ordered today.Burt Knack, RN

## 2023-09-07 ENCOUNTER — Other Ambulatory Visit: Payer: Self-pay | Admitting: Family Medicine

## 2023-09-07 ENCOUNTER — Telehealth: Payer: Self-pay

## 2023-09-07 ENCOUNTER — Encounter: Payer: Self-pay | Admitting: Family Medicine

## 2023-09-07 ENCOUNTER — Ambulatory Visit
Admission: RE | Admit: 2023-09-07 | Discharge: 2023-09-07 | Disposition: A | Discharge status: Home / Self Care | Payer: Medicaid Other | Source: Ambulatory Visit | Attending: Family Medicine | Admitting: Family Medicine

## 2023-09-07 DIAGNOSIS — Z3A19 19 weeks gestation of pregnancy: Secondary | ICD-10-CM | POA: Diagnosis not present

## 2023-09-07 DIAGNOSIS — O283 Abnormal ultrasonic finding on antenatal screening of mother: Secondary | ICD-10-CM | POA: Insufficient documentation

## 2023-09-07 DIAGNOSIS — O35FXX0 Maternal care for other (suspected) fetal abnormality and damage, fetal musculoskeletal anomalies of trunk, not applicable or unspecified: Secondary | ICD-10-CM | POA: Insufficient documentation

## 2023-09-07 DIAGNOSIS — Z3689 Encounter for other specified antenatal screening: Secondary | ICD-10-CM | POA: Insufficient documentation

## 2023-09-07 DIAGNOSIS — Z3480 Encounter for supervision of other normal pregnancy, unspecified trimester: Secondary | ICD-10-CM

## 2023-09-07 HISTORY — DX: Maternal care for other (suspected) fetal abnormality and damage, fetal musculoskeletal anomalies of trunk, not applicable or unspecified: O35.FXX0

## 2023-09-07 NOTE — Telephone Encounter (Signed)
TC to patient to inform of [facility] U/S and consult on 09/18/23 at 7:15 am. Patient given address and read back address and time and date to this RN. Patient also counseled that per [facility] MFM scheduler, Elnita Maxwell, client can call each weekday to find out if there are any cancellations so that she can be seen earlier this month. Number to call given, [phone]. Patient states understanding and denies questions at this time. Patient reminded of upcoming MH RV on 10/02/23. Marland KitchenBurt Knack, RN

## 2023-09-07 NOTE — Addendum Note (Signed)
Addended by: Lenice Llamas on: 09/07/2023 03:53 PM   Modules accepted: Orders

## 2023-09-07 NOTE — Progress Notes (Signed)
Called placed to patient on 12/6 to reviewed results of ultrasound. I discussed with her that an urgent referral would be placed to MFM for consultation and likely another ultrasound.  Questions encouraged and answered.   [facility] FNP-C

## 2023-09-18 ENCOUNTER — Ambulatory Visit: Payer: Medicaid Other

## 2023-09-18 ENCOUNTER — Other Ambulatory Visit: Payer: Self-pay | Admitting: Family Medicine

## 2023-09-18 ENCOUNTER — Other Ambulatory Visit: Payer: Self-pay | Admitting: *Deleted

## 2023-09-18 ENCOUNTER — Ambulatory Visit: Payer: Medicaid Other | Attending: Family Medicine

## 2023-09-18 ENCOUNTER — Ambulatory Visit: Payer: Medicaid Other | Admitting: *Deleted

## 2023-09-18 ENCOUNTER — Other Ambulatory Visit: Payer: Self-pay

## 2023-09-18 ENCOUNTER — Ambulatory Visit (HOSPITAL_BASED_OUTPATIENT_CLINIC_OR_DEPARTMENT_OTHER): Payer: Medicaid Other | Admitting: Obstetrics and Gynecology

## 2023-09-18 VITALS — BP 118/55 | HR 76

## 2023-09-18 DIAGNOSIS — E669 Obesity, unspecified: Secondary | ICD-10-CM

## 2023-09-18 DIAGNOSIS — O209 Hemorrhage in early pregnancy, unspecified: Secondary | ICD-10-CM

## 2023-09-18 DIAGNOSIS — A749 Chlamydial infection, unspecified: Secondary | ICD-10-CM

## 2023-09-18 DIAGNOSIS — Q872 Congenital malformation syndromes predominantly involving limbs: Secondary | ICD-10-CM | POA: Insufficient documentation

## 2023-09-18 DIAGNOSIS — Z363 Encounter for antenatal screening for malformations: Secondary | ICD-10-CM | POA: Insufficient documentation

## 2023-09-18 DIAGNOSIS — D649 Anemia, unspecified: Secondary | ICD-10-CM

## 2023-09-18 DIAGNOSIS — Z3A22 22 weeks gestation of pregnancy: Secondary | ICD-10-CM

## 2023-09-18 DIAGNOSIS — O283 Abnormal ultrasonic finding on antenatal screening of mother: Secondary | ICD-10-CM

## 2023-09-18 DIAGNOSIS — O99012 Anemia complicating pregnancy, second trimester: Secondary | ICD-10-CM | POA: Insufficient documentation

## 2023-09-18 DIAGNOSIS — O35FXX0 Maternal care for other (suspected) fetal abnormality and damage, fetal musculoskeletal anomalies of trunk, not applicable or unspecified: Secondary | ICD-10-CM | POA: Diagnosis not present

## 2023-09-18 DIAGNOSIS — O99210 Obesity complicating pregnancy, unspecified trimester: Secondary | ICD-10-CM

## 2023-09-18 DIAGNOSIS — O99212 Obesity complicating pregnancy, second trimester: Secondary | ICD-10-CM

## 2023-09-18 DIAGNOSIS — O99214 Obesity complicating childbirth: Secondary | ICD-10-CM | POA: Insufficient documentation

## 2023-09-18 DIAGNOSIS — Z3480 Encounter for supervision of other normal pregnancy, unspecified trimester: Secondary | ICD-10-CM

## 2023-09-18 DIAGNOSIS — Z31430 Encounter of female for testing for genetic disease carrier status for procreative management: Secondary | ICD-10-CM

## 2023-09-18 NOTE — Addendum Note (Signed)
Addended by: Carolanne Grumbling on: 09/18/2023 10:34 AM   Modules accepted: Orders

## 2023-09-18 NOTE — Progress Notes (Signed)
[facility] for Maternal Fetal Care at [facility] [address] Phone:  [phone]   Fax:  [phone]      In-Person Genetic Counseling Clinic Note:   I spoke with 22 y.o. 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 She was referred by Lenice Llamas, FNP. She was accompanied by her mother.   Pregnancy History:    G2P1001. EGA: [redacted]w[redacted]d by [place]. EDD: 01/21/2024. This is Jasmin Zimmerman's second pregnancy. She has a 56 yo son. Denies personal health concerns. Denies bleeding, infections, and fevers in this pregnancy. Denies using tobacco, alcohol, or street drugs in this pregnancy.   Family History:    A pedigree was not drawn due to the nature of today's discussion. Patient was asked about her and FOB's personal and family history. Family history reported to not be remarkable for consanguinity, individuals with birth defects, intellectual disability, autism spectrum disorder, multiple spontaneous abortions, still births, or unexplained neonatal death.   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 Please see ultrasound report for details. An omphalocele is a herniation or pouch from the abdominal wall that contains abdominal contents, usually part of the small or large intestine, and possibly other organs. This structure also involves the umbilical cord. We discussed that infants with an omphalocele have an increased risk for other congenital abnormalities. Approximately 30-50% of babies with an omphalocele will have other birth defects, with structural differences of the heart, urinary tract, gastrointestinal tract, cleft lip and/or palate and neural tube defects being the most common. Omphalocele may occur as an isolated birth defect or are part of numerous genetic conditions. Specifically, there is an increased risk for chromosome abnormalities of approximately 30-40%, most  often trisomy 96 or trisomy 66, which are associated with a very poor prognosis. In the setting of typical chromosomes, omphalocele may be a feature of another genetic syndrome, caused by genetic changes within a single gene or deletion, duplication, or methylation of a series of contiguous genes. One example of a genetic syndrome with omphalocele as a major presenting feature is [place] Wiedemann syndrome. It is present in approximately 25% of all children with an omphalocele. Beckwith-Wiedemann syndrome (BWS) is a growth disorder characterized by macrosomia, macroglossia, visceromegaly, embryonal tumors, omphalocele, neonatal hypoglycemia, dysmorphic features, and renal abnormalities, as well as developmental delays and other features. Prenatal diagnosis for BWS and other genetic syndromes may be possible through amniocentesis.   We discussed that if no other congenital abnormalities are found, the majority of infants with an omphalocele will undergo surgery shortly after birth to repair the abdominal wall defect. Studies have shown that their development tends to be relatively normal and, if isolated, this condition is not necessarily associated with intellectual disabilities or other physical disabilities.    We reviewed the technical aspects, benefits, risks, and limitations of amniocentesis including the 1 in 500 risk for miscarriage. Gabrella elected amniocentesis, and this was performed today by Dr. Judeth Cornfield, MFM. We will order karyotype, chromosomal microarray, AF-AFP, and maternal cell contamination. Depending on the results, reflex testing for Beckwith-Wiedemann analysis may be ordered.  Carrier Screening:  Jasmin Zimmerman has not had carrier screening performed. We briefly discussed the benefits, risks, and limitations of carrier screening. We reviewed that ACOG Committee Opinion [phone] recommends all pregnant people be screened for hemoglobinopathies, cystic fibrosis, and spinal muscular atrophy. Patients may  elect to be screened for additional conditions. Jasmin Zimmerman elected carrier screening for  the ACOG recommended conditions Special educational needs teacher). Her blood was drawn during today's visit.   Newborn Screening. The [place] Newborn Screening (NBS) program will screen all newborn babies for cystic fibrosis, spinal muscular atrophy, hemoglobinopathies, and numerous other conditions.  Previous Testing Completed:  Low risk NIPS: [facility] previously completed MaterniT21 noninvasive prenatal screening (NIPS) in this pregnancy. The result is low risk, consistent with a female fetus. This screening significantly reduces but does not eliminate the chance that the current pregnancy has Down syndrome (trisomy 39), trisomy 33, trisomy 55, and common sex chromosome conditions. Please see report for details. There are many genetic conditions that cannot be detected by NIPS.   Negative ms-AFP screening: Jasmin Zimmerman previously completed a maternal serum AFP screen in this pregnancy. The result is screen negative. Please see report for details. A negative result reduces the risk that the current pregnancy has an open neural tube defect. Closed neural tube defects and some open defects may not be detected by this screen. We have ordered diagnostic AF-AFP on the amniotic fluid sample.   Plan of Care:   Karyotype, chromosomal microarray, AF-AFP were ordered on the amniotic fluid sample. Maternal cell contamination was drawn. Depending on the results, we may reflex to Beckwith-Wiedemann genetic testing. Horizon Basic (alpha thalassemia, beta-hemoglobinopathies, cystic fibrosis, and spinal muscular atrophy) was drawn today. Referral for fetal echocardiogram at [facility] was placed.    Informed consent was obtained. All questions were answered.   I spent 30 minutes in the care of the patient today, including face-to-face time reviewing and  discussing the genetic test results and available next steps.    Thank you for sharing in the care of  Jasmin Zimmerman with [place].  Please do not hesitate to contact us at [phone] if you have any questions.   Sheppard Plumber, MS Genetic Counselor   Genetic counseling student involved in appointment: No.

## 2023-09-18 NOTE — Addendum Note (Signed)
Addended by: Carolanne Grumbling on: 09/18/2023 10:52 AM   Modules accepted: Orders

## 2023-09-18 NOTE — Progress Notes (Signed)
Maternal-Fetal Medicine   Name: Jasmin Zimmerman DOB: 01-06-2001 MRN: [id number] Referring Provider: Lenice Llamas, FNP  I had the pleasure of seeing Jasmin Zimmerman today at the [facility]. She was accompanied by her mother. She is G2 P1001 at 22w 1d gestation and is here for a second opinion. At ultrasound performed on 09/07/23 at the Radiology Department, abdominal wall defect was suspected.  Her pregnancy is well-dated by 6-week ultrasound. Report mentions the possibility of "vanishing twin." On cell-free fetal DNA screening, the risks of fetal aneuploidies are not increased. MSAFP screening showed low-risk for open-neural tube defects (AFP 2.43 MoM).  Obstetric history is significant for a term vaginal delivery in June 2021 of a female infant.  Her son is in good health. Past medical history: No history of diabetes or hypertension. Medications: Low-dose aspirin, prenatal vitamins. Allergies: No known drug allergies. Social history: Denies tobacco or drug or alcohol use.  Her partner is the father of her first child.  Ultrasound We performed a fetal anatomical survey.  Amniotic fluid is normal and good fetal activity seen.  Fetal biometry is consistent with the previously established dates.  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  Liver appears to be the main herniated mass.  No bowel peristalsis is seen in the sac.  Umbilical cord is clearly seen attached to the sac.  Color Doppler shows presence of hepatic vessel and the sac. -Largest diameter of the sac measures 2.3 cm. -Intracranial structures and spine appear normal.  Both kidneys are seen. -Cardiac anatomy is very limited because of fetal position. -Jasmin Zimmerman appears female but could not be well-visualized.  Our concerns include: Omphalocele -Abdominal defect covered by amnion and peritoneum with the umbilical cord connecting to the center of this  membrane.  -Incidence: 6 per 10,000 live births. More common in women over 66 years of age. Slightly higher in female fetuses and multiple gestations.  -Chromosomal anomalies are present in 25% to 50% of cases and additional structural anomalies in up to 50%-80%. Association with Beckwith-Wiedemann syndrome is well known. Other syndromes include VACTERL association, and OEIS complex. Fetal echocardiography is recommended because of increased likelihood of cardiac defects.  -Fetal growth restriction and stillbirth rates are increased. Preterm delivery is more common.  -Omphalocele is considered large ("giant") if it measures greater than 5 centimeter or if more than 50% of liver is herniated. Vaginal delivery is not contraindicated unless the sac is large or if the patient prefers elective cesarean delivery.  Patient was informed that we will refer her to a tertiary center (she agreed with [facility]) for prenatal care and delivery.  I counseled the patient and her mother on the finding with help of ultrasound images and diagrams. I recommended genetic counseling and amniocentesis. I discussed amniocentesis procedure and possible complications including miscarriage (1 in 500 procedures).  Patient and her mother met with our genetic counselor today. After counseling, the patient opted to have amniocentesis.  After informed consent, amniocentesis was performed by Dr. Judeth Cornfield under ultrasound guidance and 43 milliliters of clear amniotic fluid was withdrawn. Initial 3 mL of fluid was discarded to prevent maternal-cell contamination. Fluid was sent for AFP, fetal karyotype and microarray analysis. Patient tolerated the procedure well. Post-procedure fetal heart rate was normal.  Patient went to [facility] to have her blood drawn for maternal-cell contamination.  Blood type: O positive.  Recommendations -An appointment was made for her to return in 4 weeks for completion  of fetal anatomy. -We  have requested an appointment for fetal echocardiography (pediatric cardiology, [facility]). -Fetal growth assessments every 4 weeks. -Weekly BPP from [redacted] weeks gestation till delivery. -We will make referral to [facility], [facility].  Thank you for consultation.  If you have any questions or concerns, please contact me the [facility].  Consultation including face-to-face (more than 50%) counseling 45 minutes.

## 2023-09-18 NOTE — Progress Notes (Signed)
I took the amniotic fluid sample downstairs to [employer] [facility].

## 2023-09-21 LAB — AFP, AMNIOTIC FLUID
AFP, Amniotic Fluid (mcg/ml): 9.1 ug/mL
Gestational Age(Wks): 22
MOM, Amniotic Fluid: 1.97

## 2023-09-28 ENCOUNTER — Telehealth: Payer: Self-pay

## 2023-09-28 NOTE — Telephone Encounter (Signed)
I left a message asking patient to call back to discuss test results.

## 2023-09-28 NOTE — Telephone Encounter (Signed)
Patient returned call. We reviewed the test results from amniocentesis. Chromosomal microarray returned as normal, female: arr(X,Y)x1,(1-22)x2. Therefore, the lab did not identify deletions and/or duplications within the sample.   Karyotype results are pending; although, it is unlikely that the karyotype would be abnormal after a normal chromosomal microarray.  AF-AFP testing was negative and levels were within normal range.   We discussed that Beckwith-Wiedemann testing can be ordered, since the chromosomal analysis performed does not identify all possible genetic causes of omphalocele. The patient would like to wait for the karyotype results before deciding.  Horizon carrier screening results are pending.  Sheppard Plumber, MS Genetic Counselor [facility] for M[facility] Fetal Care [phone]

## 2023-10-01 LAB — HORIZON CUSTOM: REPORT SUMMARY: NEGATIVE

## 2023-10-01 NOTE — Progress Notes (Signed)
Erroneous - disregard °

## 2023-10-02 ENCOUNTER — Telehealth: Payer: Self-pay

## 2023-10-02 ENCOUNTER — Encounter: Payer: Medicaid Other | Admitting: Family Medicine

## 2023-10-02 LAB — MATERNAL CELL CONTAMINATION

## 2023-10-02 LAB — CHROMOSOME, AMNIOTIC FLUID
Cells Analyzed: 15
Cells Counted: 15
Cells Karyotyped: 2
Colonies: 15
GTG Band Resolution Achieved: 400

## 2023-10-02 LAB — DIRECT PRENATAL SNP CMA

## 2023-10-02 NOTE — Telephone Encounter (Signed)
 Patient returned call. We reviewed her genetic test results from amniocentesis and her Horizon basic carrier screening.  Karyotype and chromosomal microarray returned as normal, female: 62, XY. No chromosomal abnormalities were detected by the lab. Please see reports for details.  Amniotic fluid AFP was within normal limits. Please see report for details.  Horizon carrier screening for alpha thalassemia, beta-hemoglobinopathies, cystic fibrosis, and spinal muscular atrophy. This significantly reduces but does not eliminate the chance of being a carrier. Please see report for details.  We discussed Willian Luna testing as a follow-up. I told the patient I can ask [facility] for the CPT code so she can inquire about coverage, since [facility] does not provide me with cost estimates for patients. Sagan declined further testing at the time and would like to proceed with postnatal genetic testing if necessary.  Lauraine Bodily, MS Genetic Counselor [facility] for M[facility] Fetal [facility] [phone]

## 2023-10-02 NOTE — Telephone Encounter (Signed)
I left a message asking the patient to call back to discuss test results.

## 2023-10-04 ENCOUNTER — Encounter: Payer: Self-pay | Admitting: Physician Assistant

## 2023-10-04 ENCOUNTER — Ambulatory Visit: Payer: Medicaid Other

## 2023-10-04 VITALS — BP 110/64 | HR 76 | Temp 97.5°F | Wt 213.8 lb

## 2023-10-04 DIAGNOSIS — O99210 Obesity complicating pregnancy, unspecified trimester: Secondary | ICD-10-CM

## 2023-10-04 DIAGNOSIS — O35FXX0 Maternal care for other (suspected) fetal abnormality and damage, fetal musculoskeletal anomalies of trunk, not applicable or unspecified: Secondary | ICD-10-CM

## 2023-10-04 DIAGNOSIS — O99019 Anemia complicating pregnancy, unspecified trimester: Secondary | ICD-10-CM

## 2023-10-04 DIAGNOSIS — O0990 Supervision of high risk pregnancy, unspecified, unspecified trimester: Secondary | ICD-10-CM

## 2023-10-04 DIAGNOSIS — O99212 Obesity complicating pregnancy, second trimester: Secondary | ICD-10-CM

## 2023-10-04 DIAGNOSIS — O0992 Supervision of high risk pregnancy, unspecified, second trimester: Secondary | ICD-10-CM | POA: Diagnosis not present

## 2023-10-04 DIAGNOSIS — Z3A24 24 weeks gestation of pregnancy: Secondary | ICD-10-CM

## 2023-10-04 DIAGNOSIS — O99012 Anemia complicating pregnancy, second trimester: Secondary | ICD-10-CM

## 2023-10-04 NOTE — Progress Notes (Signed)
  [facility] [address], [address] [place] [address] Main phone: [phone]  Prenatal Visit  Subjective:  Jasmin Zimmerman is a 23 y.o. G2P1001 at [redacted]w[redacted]d being seen today for ongoing prenatal care.  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  Patient reports no complaints.  Contractions: Not present. Vag. Bleeding: None.  Movement: Present. Denies leaking of fluid/ROM.   The following portions of the patient's history were reviewed and updated as appropriate: allergies, current medications, past family history, past medical history, past social history, past surgical history and problem list. Problem list updated.  Objective:   Vitals:   10/04/23 1531  BP: 110/64  Pulse: 76  Temp: (!) 97.5 F (36.4 C)  Weight: 213 lb 12.8 oz (97 kg)    Fetal Status: Fetal Heart Rate (bpm): 132 Fundal Height: 25 cm Movement: Present     General:  Alert, oriented and cooperative. Patient is in no acute distress.  Skin: Skin is warm and dry. No rash noted.   Cardiovascular: Normal heart rate noted  Respiratory: Normal respiratory effort, no problems with respiration noted  Abdomen: Soft, gravid, appropriate for gestational age.  Pain/Pressure: Absent     Pelvic: Cervical exam deferred        Extremities: Normal range of motion.  Edema: None  Mental Status: Normal mood and affect. Normal behavior. Normal judgment and thought content.   Assessment and Plan:  Pregnancy: G2P1001 at [redacted]w[redacted]d  1. Supervision of high risk pregnancy, antepartum (Primary) Reviewed plan of care  with pt  with regards to fetal omphalocele - she desires co-management with [facility] if possible.  2. [redacted] weeks gestation of pregnancy RV in 3 weeks. Anticipatory guidance re: routine 28-week labs/O'Sullivan at that visit.  3. 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 Obesity affecting pregnancy, antepartum, unspecified obesity type TWG 13 lb. Continue daily low-dose aspirin .  5. Anemia, antepartum Continue oral iron  supplement.   Preterm labor symptoms and general obstetric precautions including but not limited to vaginal bleeding, contractions, leaking of fluid and fetal movement were reviewed in detail with the patient. Please refer to After Visit Summary for other counseling recommendations.  Return in about 3 weeks (around 10/25/2023) for Routine prenatal care, 28 wk labs (before 10:30am or 3pm).  Future Appointments  Date Time Provider Department Center  10/16/2023  2:15 PM [facility] NURSE [facility]  10/16/2023  2:30 PM [facility] US5 [facility]  10/25/2023 10:00 AM [facility] PROVIDER [facility] None  11/01/2023  9:00 AM [facility] PROVIDER [facility] None    Saul Irvine, PA-C

## 2023-10-10 ENCOUNTER — Telehealth: Payer: Self-pay

## 2023-10-10 ENCOUNTER — Encounter: Payer: Self-pay | Admitting: *Deleted

## 2023-10-10 NOTE — Telephone Encounter (Signed)
 Per staff message from Dr. 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 Call to client, per staff message, to verify she is aware of above and left message to call regarding transfer of prenatal care and number to call provided. Burnadette Lowers, RN

## 2023-10-10 NOTE — Telephone Encounter (Signed)
 Call received in [facility] from client and counseled on transfer of care to [facility] for her prenatal care due to infant health concerns. Client counseled if has not heard from [facility] within a week regarding appt to notify [facility] at [facility]. Client states she will call us  if no appt scheduled within the week. Client questioning if she should return here for 6 week post-partum check and counseled to discuss with her [facility] providers and she agreed to do so. Burnadette Lowers, RN

## 2023-10-16 ENCOUNTER — Other Ambulatory Visit: Payer: Self-pay

## 2023-10-16 ENCOUNTER — Other Ambulatory Visit: Payer: Self-pay | Admitting: Obstetrics and Gynecology

## 2023-10-16 ENCOUNTER — Ambulatory Visit: Payer: Medicaid Other

## 2023-10-16 ENCOUNTER — Ambulatory Visit: Payer: Medicaid Other | Attending: Obstetrics and Gynecology

## 2023-10-16 ENCOUNTER — Encounter: Payer: Self-pay | Admitting: *Deleted

## 2023-10-16 ENCOUNTER — Ambulatory Visit: Payer: Medicaid Other | Attending: Obstetrics | Admitting: Obstetrics

## 2023-10-16 VITALS — BP 116/59 | HR 69

## 2023-10-16 DIAGNOSIS — O35FXX0 Maternal care for other (suspected) fetal abnormality and damage, fetal musculoskeletal anomalies of trunk, not applicable or unspecified: Secondary | ICD-10-CM | POA: Diagnosis present

## 2023-10-16 DIAGNOSIS — O358XX0 Maternal care for other (suspected) fetal abnormality and damage, not applicable or unspecified: Secondary | ICD-10-CM | POA: Diagnosis not present

## 2023-10-16 DIAGNOSIS — A749 Chlamydial infection, unspecified: Secondary | ICD-10-CM | POA: Diagnosis present

## 2023-10-16 DIAGNOSIS — O0990 Supervision of high risk pregnancy, unspecified, unspecified trimester: Secondary | ICD-10-CM

## 2023-10-16 DIAGNOSIS — Z3A26 26 weeks gestation of pregnancy: Secondary | ICD-10-CM

## 2023-10-16 DIAGNOSIS — O209 Hemorrhage in early pregnancy, unspecified: Secondary | ICD-10-CM | POA: Insufficient documentation

## 2023-10-16 DIAGNOSIS — D649 Anemia, unspecified: Secondary | ICD-10-CM

## 2023-10-16 DIAGNOSIS — O365920 Maternal care for other known or suspected poor fetal growth, second trimester, not applicable or unspecified: Secondary | ICD-10-CM | POA: Diagnosis not present

## 2023-10-16 DIAGNOSIS — E669 Obesity, unspecified: Secondary | ICD-10-CM

## 2023-10-16 DIAGNOSIS — O99012 Anemia complicating pregnancy, second trimester: Secondary | ICD-10-CM | POA: Insufficient documentation

## 2023-10-16 DIAGNOSIS — O283 Abnormal ultrasonic finding on antenatal screening of mother: Secondary | ICD-10-CM

## 2023-10-16 DIAGNOSIS — O98819 Other maternal infectious and parasitic diseases complicating pregnancy, unspecified trimester: Secondary | ICD-10-CM | POA: Diagnosis present

## 2023-10-16 DIAGNOSIS — O99212 Obesity complicating pregnancy, second trimester: Secondary | ICD-10-CM

## 2023-10-17 ENCOUNTER — Other Ambulatory Visit: Payer: Self-pay | Admitting: *Deleted

## 2023-10-17 DIAGNOSIS — O35FXX0 Maternal care for other (suspected) fetal abnormality and damage, fetal musculoskeletal anomalies of trunk, not applicable or unspecified: Secondary | ICD-10-CM

## 2023-10-17 NOTE — Progress Notes (Signed)
 MFM Note  Jasmin Zimmerman is currently at 26 weeks and 1 day.  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  She had an amniocentesis which showed normal 46, XY chromosomes.    She denies any problems since her last exam and reports feeling fetal movements throughout the day.    On today's exam, the EFW of 1 pound 8 ounces measures at the 2nd percentile for her gestational age indicating IUGR.  There was normal amniotic fluid noted.    Fetal movements were noted throughout today's exam.  The patient was advised that the differential diagnosis of the anterior abdominal wall defect includes an umbilical hernia as the umbilical cord is noted to be inserting directly into the fetal abdomen.  The possibility that this finding is a small omphalocele was also discussed.    The patient was advised that her baby will require surgical repair of the anterior abdominal wall defect after birth.  We will transfer her care to [facility] so that her baby can get the appropriate surgical specialty care after birth.  The patient was reassured that IUGR is a common finding in fetuses with anterior abdominal wall defects as the intra-abdominal contents are herniated into the umbilical cord or sac.  Doppler studies of the umbilical arteries showed a normal S/D ratio of 4.73 .  There were no signs of absent or reversed end-diastolic flow.    Due to IUGR, she will return in 2 weeks for an NST and umbilical artery Doppler study.  I will contact [facility] to ensure that the patient gets her prenatal appointment set up with them.   The patient has a fetal echocardiogram scheduled with [facility] pediatric cardiology on October 26, 2023.    The patient and her partner stated that all of their questions were answered today.  A total of 30 minutes was spent counseling and coordinating the care for this patient.  Greater than 50% of the time was spent in direct face-to-face contact.

## 2023-10-23 ENCOUNTER — Encounter: Payer: Self-pay | Admitting: *Deleted

## 2023-10-23 DIAGNOSIS — O365990 Maternal care for other known or suspected poor fetal growth, unspecified trimester, not applicable or unspecified: Secondary | ICD-10-CM | POA: Insufficient documentation

## 2023-10-23 HISTORY — DX: Maternal care for other known or suspected poor fetal growth, unspecified trimester, not applicable or unspecified: O36.5990

## 2023-10-25 ENCOUNTER — Ambulatory Visit: Payer: Medicaid Other

## 2023-10-30 ENCOUNTER — Ambulatory Visit: Payer: Medicaid Other

## 2023-10-30 ENCOUNTER — Other Ambulatory Visit: Payer: Medicaid Other

## 2023-10-31 ENCOUNTER — Ambulatory Visit: Payer: Medicaid Other

## 2023-11-01 ENCOUNTER — Ambulatory Visit: Payer: Medicaid Other

## 2023-11-06 ENCOUNTER — Ambulatory Visit: Payer: Medicaid Other

## 2024-01-28 ENCOUNTER — Other Ambulatory Visit: Payer: Self-pay

## 2024-01-28 ENCOUNTER — Emergency Department
Admission: EM | Admit: 2024-01-28 | Discharge: 2024-01-29 | Disposition: A | Discharge status: Home / Self Care | Attending: Emergency Medicine | Admitting: Emergency Medicine

## 2024-01-28 ENCOUNTER — Encounter: Payer: Self-pay | Admitting: *Deleted

## 2024-01-28 DIAGNOSIS — S51852A Open bite of left forearm, initial encounter: Secondary | ICD-10-CM | POA: Diagnosis present

## 2024-01-28 DIAGNOSIS — Z23 Encounter for immunization: Secondary | ICD-10-CM | POA: Insufficient documentation

## 2024-01-28 DIAGNOSIS — W540XXA Bitten by dog, initial encounter: Secondary | ICD-10-CM | POA: Diagnosis not present

## 2024-01-28 NOTE — ED Notes (Signed)
 [employer]animal control  contacted about dog bite by this rn.  Jasmin Zimmerman

## 2024-01-28 NOTE — ED Notes (Signed)
 Pt presented to ED with c/o dog bite to left forearm. 2 puncture wounds noted, one anteriorly and one posteriorly. Bleeding controlled. Wounds irrigated with 200ml NS. Pt tolerated well. Bruising noted to left lower wrist

## 2024-01-28 NOTE — ED Triage Notes (Signed)
 Pt ambulatory to triage.  Pt has dog bite and bruising to left forearm.  Bleeding controlled.  Pt states it occurred at noon today.  Pt alert.

## 2024-01-29 ENCOUNTER — Emergency Department

## 2024-01-29 MED ORDER — ACETAMINOPHEN 500 MG PO TABS
1000.0000 mg | ORAL_TABLET | Freq: Once | ORAL | Status: AC
  Administered 2024-01-29: 1000 mg via ORAL
  Filled 2024-01-29: qty 2

## 2024-01-29 MED ORDER — METRONIDAZOLE 500 MG PO TABS
500.0000 mg | ORAL_TABLET | Freq: Once | ORAL | Status: AC
  Administered 2024-01-29: 500 mg via ORAL
  Filled 2024-01-29: qty 1

## 2024-01-29 MED ORDER — METRONIDAZOLE 500 MG PO TABS: 500.0000 mg | ORAL_TABLET | Freq: Three times a day (TID) | ORAL | 0 refills | Status: AC

## 2024-01-29 MED ORDER — IBUPROFEN 800 MG PO TABS
800.0000 mg | ORAL_TABLET | Freq: Once | ORAL | Status: AC
  Administered 2024-01-29: 800 mg via ORAL
  Filled 2024-01-29: qty 1

## 2024-01-29 MED ORDER — BACITRACIN ZINC 500 UNIT/GM EX OINT
TOPICAL_OINTMENT | Freq: Once | CUTANEOUS | Status: AC
  Administered 2024-01-29: 1 via TOPICAL
  Filled 2024-01-29: qty 1.8

## 2024-01-29 MED ORDER — RABIES IMMUNE GLOBULIN 150 UNIT/ML IM INJ
20.0000 [IU]/kg | INJECTION | Freq: Once | INTRAMUSCULAR | Status: AC
  Administered 2024-01-29: 1875 [IU]
  Filled 2024-01-29: qty 14

## 2024-01-29 MED ORDER — RABIES VACCINE, PCEC IM SUSR
1.0000 mL | Freq: Once | INTRAMUSCULAR | Status: AC
  Administered 2024-01-29: 1 mL via INTRAMUSCULAR
  Filled 2024-01-29: qty 1

## 2024-01-29 MED ORDER — TETANUS-DIPHTH-ACELL PERTUSSIS 5-2.5-18.5 LF-MCG/0.5 IM SUSY
0.5000 mL | PREFILLED_SYRINGE | Freq: Once | INTRAMUSCULAR | Status: AC
  Administered 2024-01-29: 0.5 mL via INTRAMUSCULAR
  Filled 2024-01-29: qty 0.5

## 2024-01-29 MED ORDER — CEFUROXIME AXETIL 500 MG PO TABS
500.0000 mg | ORAL_TABLET | Freq: Once | ORAL | Status: AC
Start: 2024-01-29 — End: 2024-01-29
  Administered 2024-01-29: 500 mg via ORAL
  Filled 2024-01-29: qty 1

## 2024-01-29 MED ORDER — CEFUROXIME AXETIL 500 MG PO TABS
500.0000 mg | ORAL_TABLET | Freq: Two times a day (BID) | ORAL | 0 refills | Status: AC
Start: 2024-01-29 — End: ?

## 2024-01-29 NOTE — ED Provider Notes (Signed)
 [facility] Provider Note    Event Date/Time   First MD Initiated Contact with Patient 01/28/24 2343     (approximate)   History   Animal Bite   HPI  Jasmin Zimmerman is a 23 y.o. female with no significant past medical history who presents to the emergency department with 2 puncture wounds to the left forearm after she was bit by a stray dog around noon today.  She is unsure of her last tetanus vaccine.  The dog is with animal control but it is unclear if the dog has had any rabies vaccines.  She states that she is having pain and bruising to her arm.  She has been alternating Tylenol  and Motrin .  She is currently breast-feeding.   History provided by patient, husband.    Past Medical History:  Diagnosis Date   Anemia affecting pregnancy in third trimester 01/22/2020   Biliary colic 08/27/2019   @ ED-small gallstones seen   History of biliary colic 09/03/2019   Dx [facility] 16/07/9603     Postpartum endometritis 03/14/2020    Past Surgical History:  Procedure Laterality Date   Left upper arm surgery     Denies shoulder surgery. States had circle on skin removed in case became cancerous. Surgery in middle school.   PERINEAL LACERATION REPAIR  03/14/2020   Procedure: SUTURE REPAIR PERINEAL LACERATION;  Surgeon: Prescilla Brod, MD;  Location: [facility] ORS;  Service: Gynecology;;   REPAIR VAGINAL CUFF N/A 03/09/2020   Procedure: REPAIR VAGINAL CUFF;  Surgeon: Carolynn Citrin, MD;  Location: [facility] ORS;  Service: Gynecology;  Laterality: N/A;   WISDOM TOOTH EXTRACTION Bilateral 2018   upper and lower    MEDICATIONS:  Prior to Admission medications   Medication Sig Start Date End Date Taking? Authorizing Provider  acetaminophen  (TYLENOL ) 325 MG tablet Take 2 tablets (650 mg total) by mouth every 4 (four) hours as needed (for pain scale < 4). 03/11/20   McVey, Georges Kings, CNM  calcium carbonate (TUMS EX) 750 MG chewable tablet Chew 1 tablet by mouth  daily.    [provider]  Iron , Ferrous Sulfate , 325 (65 Fe) MG TABS Take 1 tablet by mouth daily at 6 (six) AM. Patient not taking: Reported on 10/04/2023 06/07/23   Santana Cue, MD  Prenatal Vit-Fe Fumarate-FA (PREPLUS) 27-1 MG TABS Take 1 tablet by mouth daily. 06/07/23   Streilein, Annamarie, PA-C    Physical Exam   Triage Vital Signs: ED Triage Vitals [01/28/24 2113]  Encounter Vitals Group     BP 114/78     Systolic BP Percentile      Diastolic BP Percentile      Pulse Rate 66     Resp 18     Temp 98.1 F (36.7 C)     Temp src      SpO2 100 %     Weight 205 lb (93 kg)     Height 5\' 1"  (1.549 m)     Head Circumference      Peak Flow      Pain Score 0     Pain Loc      Pain Education      Exclude from Growth Chart     Most recent vital signs: Vitals:   01/28/24 2113 01/29/24 0127  BP: 114/78 124/66  Pulse: 66 84  Resp: 18 18  Temp: 98.1 F (36.7 C) (!) 97.5 F (36.4 C)  SpO2: 100% 100%    CONSTITUTIONAL:  Alert, responds appropriately to questions. Well-appearing; well-nourished HEAD: Normocephalic, atraumatic EYES: Conjunctivae clear, pupils appear equal, sclera nonicteric ENT: normal nose; moist mucous membranes NECK: Supple, normal ROM CARD: RRR; S1 and S2 appreciated RESP: Normal chest excursion without splinting or tachypnea; breath sounds clear and equal bilaterally; no wheezes, no rhonchi, no rales, no hypoxia or respiratory distress, speaking full sentences ABD/GI: Non-distended; soft, non-tender, no rebound, no guarding, no peritoneal signs BACK: The back appears normal EXT: Patient has 2 puncture wounds to the mid forearm.  She does have associated soft tissue swelling and bruising but compartments are soft.  2+ left radial pulse.  Normal capillary refill.  No increased warmth, redness noted around the bites. SKIN: Normal color for age and race; warm; no rash on exposed skin NEURO: Moves all extremities equally, normal speech PSYCH: The  patient's mood and manner are appropriate.   ED Results / Procedures / Treatments   LABS: (all labs ordered are listed, but only abnormal results are displayed) Labs Reviewed - No data to display   EKG:  EKG Interpretation Date/Time:    Ventricular Rate:    PR Interval:    QRS Duration:    QT Interval:    QTC Calculation:   R Axis:      Text Interpretation:           RADIOLOGY: My personal review and interpretation of imaging: X-ray shows no fracture.  I have personally reviewed all radiology reports.   DG Forearm Left Result Date: 01/29/2024 CLINICAL DATA:  Dog bite EXAM: LEFT FOREARM - 2 VIEW COMPARISON:  None Available. FINDINGS: There is no evidence of fracture or other focal bone lesions. Punctate subcutaneous gas noted ulnar aspect of the mid left forearm. No retained radiopaque foreign body. IMPRESSION: 1. Punctate subcutaneous gas. No retained radiopaque foreign body. Electronically Signed   By: Worthy Heads M.D.   On: 01/29/2024 00:38     PROCEDURES:  Critical Care performed: No     Procedures    IMPRESSION / MDM / ASSESSMENT AND PLAN / ED COURSE  I reviewed the triage vital signs and the nursing notes.    Patient here with 2 small puncture wounds from a dog bite.     DIFFERENTIAL DIAGNOSIS (includes but not limited to):   Dog bite, contusion, fracture, no sign of cellulitis or abscess   Patient's presentation is most consistent with acute presentation with potential threat to life or bodily function.   PLAN: Will update patient's tetanus vaccine.  Have offered rabies immunoglobulin and vaccination here given vaccination status of the stray dog is unknown.  Patient agrees to proceed.  Will give Tylenol , Motrin  for pain control.  Have offered stronger pain medication which she declines.  Will start her on prophylactic antibiotics.  No current signs of infection.  Will clean wounds, apply bacitracin and sterile dressing.  Wounds are less than a  centimeter each.  Discussed with patient that I would not recommend closing these wounds and she is comfortable with this plan.  Will obtain x-ray of the forearm to ensure no fracture.   MEDICATIONS GIVEN IN ED: Medications  ibuprofen  (ADVIL ) tablet 800 mg (800 mg Oral Given 01/29/24 0026)  acetaminophen  (TYLENOL ) tablet 1,000 mg (1,000 mg Oral Given 01/29/24 0026)  Tdap (BOOSTRIX) injection 0.5 mL (0.5 mLs Intramuscular Given 01/29/24 0026)  rabies vaccine (RABAVERT) injection 1 mL (1 mL Intramuscular Given 01/29/24 0023)  rabies immune globulin (HYPERRAB/KEDRAB) injection 1,875 Units (1,875 Units Infiltration Given 01/29/24 0039)  cefUROXime (  CEFTIN) tablet 500 mg (500 mg Oral Given 01/29/24 0127)  metroNIDAZOLE (FLAGYL) tablet 500 mg (500 mg Oral Given 01/29/24 0026)  bacitracin ointment (1 Application Topical Given 01/29/24 0116)     ED COURSE: X-ray reviewed and interpreted by myself and the radiologist and shows no fracture or foreign body.  Will discharge with antibiotics.  Given follow-up information for urgent care and subsequent dates for the rest of her rabies vaccine series.  Reviewed that it is still safe to breast-feed even after rabies exposure and that rabies immunoglobulin, rabies vaccine and antibiotics that have been provided to her are safe with breast-feeding.   At this time, I do not feel there is any life-threatening condition present. I reviewed all nursing notes, vitals, pertinent previous records.  All lab and urine results, EKGs, imaging ordered have been independently reviewed and interpreted by myself.  I reviewed all available radiology reports from any imaging ordered this visit.  Based on my assessment, I feel the patient is safe to be discharged home without further emergent workup and can continue workup as an outpatient as needed. Discussed all findings, treatment plan as well as usual and customary return precautions.  They verbalize understanding and are comfortable  with this plan.  Outpatient follow-up has been provided as needed.  All questions have been answered.    CONSULTS:  none   OUTSIDE RECORDS REVIEWED: Reviewed last OB/GYN note on 01/22/2024.       FINAL CLINICAL IMPRESSION(S) / ED DIAGNOSES   Final diagnoses:  Dog bite, initial encounter     Rx / DC Orders   ED Discharge Orders          Ordered    cefUROXime (CEFTIN) 500 MG tablet  2 times daily with meals        01/29/24 0056    metroNIDAZOLE (FLAGYL) 500 MG tablet  3 times daily        01/29/24 0056             Note:  This document was prepared using Dragon voice recognition software and may include unintentional dictation errors.   Caydyn Sprung, Clover Dao, DO 01/29/24 [id number]

## 2024-01-29 NOTE — Discharge Instructions (Addendum)
 You may alternate Tylenol  1000 mg every 6 hours as needed for pain, fever and Ibuprofen  800 mg every 6-8 hours as needed for pain, fever.  Please take Ibuprofen  with food.  Do not take more than 4000 mg of Tylenol  (acetaminophen ) in a 24 hour period.  Rabies Vaccine Schedule (please follow up at [facility] for additional vaccines):  DAY 0:  01/29/2024    (given in the ED)  DAY 3:  02/01/2024       DAY 7:  02/05/2024     DAY 14:  02/12/2024        You may clean your wounds gently with soap, warm water and apply a dressing.  Please monitor for any signs of redness, warmth or fever at 100.4 or higher.  If you develop any signs of infection despite being on antibiotics, I recommend close follow-up with your primary care doctor or return to the emergency department.

## 2024-03-14 ENCOUNTER — Ambulatory Visit: Admitting: Nurse Practitioner

## 2024-03-14 DIAGNOSIS — A749 Chlamydial infection, unspecified: Secondary | ICD-10-CM

## 2024-03-14 DIAGNOSIS — Z113 Encounter for screening for infections with a predominantly sexual mode of transmission: Secondary | ICD-10-CM | POA: Diagnosis not present

## 2024-03-14 LAB — WET PREP FOR TRICH, YEAST, CLUE
Clue Cell Exam: NEGATIVE
Trichomonas Exam: NEGATIVE
Yeast Exam: NEGATIVE

## 2024-03-14 MED ORDER — DOXYCYCLINE HYCLATE 100 MG PO TABS: 100.0000 mg | ORAL_TABLET | Freq: Two times a day (BID) | ORAL | Status: AC

## 2024-03-14 NOTE — Progress Notes (Signed)
 Pt is here for std screening, wet prep results reviewed with pt no treatment required per standing order. Condoms given. The patient was dispensed doxycycline #14 today, also provided expedited treatment therapy as ordered. I provided counseling today regarding the medication. We discussed the medication, the side effects and when to call clinic. Patient given the opportunity to ask questions. Questions answered.   Caren Channel, RN

## 2024-03-14 NOTE — Progress Notes (Signed)
 [facility] Department STI clinic [address] [place] [address] Main phone: [phone]  STI screening visit  Subjective:  Jasmin Zimmerman is a 23 y.o. female being seen today for an STI screening visit. The patient reports they do not have symptoms.  Patient reports that they do not desire a pregnancy in the next year.   They reported they are not interested in discussing contraception today.    Patient's last menstrual period was 03/10/2024 (approximate).  Patient has the following medical conditions:  Patient Active Problem List   Diagnosis Date Noted   Chlamydia infection affecting pregnancy, antepartum 06/07/23 06/11/2023   Obesity affecting pregnancy, antepartum, pre-preg BMI 36.6 06/07/2023   Anemia, antepartum 01/22/2020   Chief Complaint  Patient presents with   SEXUALLY TRANSMITTED DISEASE    HPI Patient reports that she tested positive for chlamydia in pregnancy and threw up the treatment and her partner was not treated.  Does the patient using douching products? No  See flowsheet for further details and programmatic requirements Hyperlink available at the top of the signed note in blue.  Flow sheet content below:  Pregnancy Intention Screening Does the patient want to become pregnant in the next year?: No Does the patient's partner want to become pregnant in the next year?: No Would the patient like to discuss contraceptive options today?: No All Patients Anyone smoke around pt and/or pt's children?: No Anyone smoke inside pt's house?: No Anyone smoke inside car?: No Anyone smoke inside the workplace?: No Reason For STD Screen STD Screening: Is asymptomatic Have you ever had an STD?: Yes History of Antibiotic use in the past 2 weeks?: No (stopped keflex last month) STD Symptoms Denies all: Yes Risk Factors for Hep B Household, sexual, or needle sharing contact of a person infected with Hep B: No Sexual contact  with a person who uses drugs not as prescribed?: No Currently or Ever used drugs not as prescribed: No HIV Positive: No PRep Patient: No Men who have sex with men: No Have Hepatitis C: No History of Incarceration: No History of Homeslessness?: No Anal sex following anal drug use?: No Risk Factors for Hep C Currently using drugs not as prescribed: No Sexual partner(s) currently using drugs as not prescribed: No History of drug use: No HIV Positive: No People with a history of incarceration: No People born between the years of 41 and 59: No Abuse History Has patient ever been abused physically?: No Has patient ever been abused sexually?: No Does patient feel they have a problem with Anxiety?: No Does patient feel they have a problem with Depression?: No Referral to Behavioral Health: No Counseling Medication side effects discussed with patient?: Yes Contact card(s) given to patient: No Patient counseled to abstain from sex for: 14 days Patient counseled to abstain from sex for?: 7 days post partner's treatment Patient counseled to use condoms with all sex: Condoms declined RTC in 2-3 weeks for test results: Yes Clinic will call if test results abnormal before test result appt.: Yes Report card filled out: No Patient to return to the clinic in 3 months for TOC: Yes BCM given today through [facility] clinic: No Immunizations: Referred Test results given to patient Patient counseled to use condoms with all sex: Condoms declined STD Treatment Patient counseled to abstain from sex for: 14 days Patient counseled to abstain from sex for?: 7 days post partner's treatment   Screening for MPX risk: Does the patient have an unexplained rash? No  Is the patient MSM? No Does the patient endorse multiple sex partners or anonymous sex partners? No Did the patient have close or sexual contact with a person diagnosed with MPX? No Has the patient traveled outside the [place]  where MPX is endemic?  No Is there a high clinical suspicion for MPX-- evidenced by one of the following No  -Unlikely to be chickenpox  -Lymphadenopathy  -Rash that present in same phase of evolution on any given body part  Screenings: Last HIV test per patient/review of record was No results found for: HMHIVSCREEN  Lab Results  Component Value Date   HIV Non Reactive 06/07/2023     Last HEPC test per patient/review of record was No results found for: HMHEPCSCREEN No components found for: HEPC   Last HEPB test per patient/review of record was No components found for: HMHEPBSCREEN   Patient reports last pap was:   Lab Results  Component Value Date   SPECADGYN Comment 06/07/2023   Result Date Procedure Results Follow-ups  06/07/2023 Pap IG (Image Guided) DIAGNOSIS:: Comment Specimen adequacy:: Comment Clinician Provided ICD10: Comment Performed by:: Comment Electronically signed by:: Comment PAP Smear Comment: . Note:: Comment Test Methodology: Comment     Immunization history:  Immunization History  Administered Date(s) Administered   Influenza-Unspecified 06/07/2023   Rabies, IM 01/29/2024   Tdap 12/23/2019, 01/29/2024    The following portions of the patient's history were reviewed and updated as appropriate: allergies, current medications, past medical history, past social history, past surgical history and problem list.  Objective:  There were no vitals filed for this visit.  Physical Exam Vitals and nursing note reviewed.  Constitutional:      Appearance: Normal appearance.  HENT:     Head: Normocephalic.     Mouth/Throat:     Mouth: Mucous membranes are moist.     Comments: Mouth swab collected  Cardiovascular:     Rate and Rhythm: Normal rate.  Pulmonary:     Effort: Pulmonary effort is normal.  Abdominal:     Palpations: Abdomen is soft.  Genitourinary:    Comments: Declined genital exam- no symptoms, self swabbed  Musculoskeletal:        General: Normal range  of motion.  Lymphadenopathy:     Head:     Right side of head: No submandibular, preauricular or posterior auricular adenopathy.     Left side of head: No submandibular, preauricular or posterior auricular adenopathy.     Cervical: No cervical adenopathy.     Upper Body:     Right upper body: No supraclavicular or axillary adenopathy.     Left upper body: No supraclavicular or axillary adenopathy.   Skin:    General: Skin is warm and dry.   Neurological:     Mental Status: She is alert and oriented to person, place, and time.   Psychiatric:        Mood and Affect: Mood normal.        Behavior: Behavior normal.      Assessment and Plan:  MEGA KINKADE is a 23 y.o. female presenting to the [facility] Department for STI screening  1. Screening for venereal disease (Primary)  - Gonococcus culture - WET PREP FOR TRICH, YEAST, CLUE - Chlamydia/Gonorrhea [place] [facility]  2. Chlamydia infection affecting pregnancy, antepartum 06/07/23 Pt and her partner were not adequately treated for chlamydia during her pregnancy. She states that he has a difficult time getting to clinic for treatment d/t work and lack  of transportation Consulted w Dr. Tanis Fan. Will give pt tx for chlamydia and send her home with expedited partner treatment as well     Patient accepted the following screenings: oral GC culture, vaginal CT/GC swab, and vaginal wet prep Patient meets criteria for HepB screening? No. Ordered? no Patient meets criteria for HepC screening? No. Ordered? no  Treat wet prep per standing order Discussed time line for [facility] results and that patient will be called with positive results and encouraged patient to call if she had not heard in 2 weeks.  Counseled to return or seek care for continued or worsening symptoms Recommended repeat testing in 3 months with positive results. Recommended condom use with all sex for STI prevention.   Patient is currently using micronor  to prevent pregnancy.    Return in about 3 months (around 06/14/2024) for STI.  No future appointments.  Asmar Brozek K Demetrice Amstutz, NP

## 2024-03-19 LAB — GONOCOCCUS CULTURE
